# Patient Record
Sex: Female | Born: 1980 | Race: White | Hispanic: No | Marital: Single | Smoking: Former smoker
Health system: Southern US, Community
[De-identification: ages and names within clinical notes are randomized; demographics above are authoritative.]

## PROBLEM LIST (undated history)

## (undated) DIAGNOSIS — E1065 Type 1 diabetes mellitus with hyperglycemia: Secondary | ICD-10-CM

## (undated) DIAGNOSIS — E870 Hyperosmolality and hypernatremia: Secondary | ICD-10-CM

## (undated) DIAGNOSIS — E1069 Type 1 diabetes mellitus with other specified complication: Secondary | ICD-10-CM

## (undated) DIAGNOSIS — E785 Hyperlipidemia, unspecified: Secondary | ICD-10-CM

## (undated) HISTORY — DX: Type 1 diabetes mellitus with hyperglycemia: E10.65

## (undated) HISTORY — DX: Type 1 diabetes mellitus with other specified complication: E10.69

## (undated) HISTORY — DX: Hyperosmolality and hypernatremia: E87.0

---

## 2010-03-17 ENCOUNTER — Ambulatory Visit: Payer: Self-pay

## 2013-04-04 ENCOUNTER — Ambulatory Visit: Payer: Self-pay | Admitting: Unknown Physician Specialty

## 2013-11-01 ENCOUNTER — Encounter (INDEPENDENT_AMBULATORY_CARE_PROVIDER_SITE_OTHER): Payer: Self-pay

## 2013-11-01 ENCOUNTER — Ambulatory Visit
Admission: RE | Admit: 2013-11-01 | Discharge: 2013-11-01 | Disposition: A | Discharge status: Home / Self Care | Payer: Managed Care, Other (non HMO) | Source: Ambulatory Visit | Attending: *Deleted | Admitting: *Deleted

## 2013-11-01 ENCOUNTER — Other Ambulatory Visit: Payer: Self-pay | Admitting: *Deleted

## 2013-11-01 DIAGNOSIS — R52 Pain, unspecified: Secondary | ICD-10-CM

## 2013-11-28 ENCOUNTER — Other Ambulatory Visit (HOSPITAL_COMMUNITY): Payer: Managed Care, Other (non HMO) | Attending: Psychiatry | Admitting: Psychology

## 2013-11-28 ENCOUNTER — Encounter (HOSPITAL_COMMUNITY): Payer: Self-pay | Admitting: Psychology

## 2013-11-28 DIAGNOSIS — E109 Type 1 diabetes mellitus without complications: Secondary | ICD-10-CM | POA: Insufficient documentation

## 2013-11-28 DIAGNOSIS — Z598 Other problems related to housing and economic circumstances: Secondary | ICD-10-CM | POA: Insufficient documentation

## 2013-11-28 DIAGNOSIS — F10230 Alcohol dependence with withdrawal, uncomplicated: Secondary | ICD-10-CM

## 2013-11-28 DIAGNOSIS — F39 Unspecified mood [affective] disorder: Secondary | ICD-10-CM | POA: Insufficient documentation

## 2013-11-28 DIAGNOSIS — R51 Headache: Secondary | ICD-10-CM | POA: Insufficient documentation

## 2013-11-28 DIAGNOSIS — Z87891 Personal history of nicotine dependence: Secondary | ICD-10-CM | POA: Insufficient documentation

## 2013-11-28 DIAGNOSIS — Z9641 Presence of insulin pump (external) (internal): Secondary | ICD-10-CM | POA: Insufficient documentation

## 2013-11-28 DIAGNOSIS — Z794 Long term (current) use of insulin: Secondary | ICD-10-CM | POA: Insufficient documentation

## 2013-11-28 DIAGNOSIS — F1020 Alcohol dependence, uncomplicated: Secondary | ICD-10-CM | POA: Insufficient documentation

## 2013-11-28 DIAGNOSIS — Z5987 Material hardship due to limited financial resources, not elsewhere classified: Secondary | ICD-10-CM | POA: Insufficient documentation

## 2013-11-29 ENCOUNTER — Encounter (HOSPITAL_COMMUNITY): Payer: Self-pay | Admitting: Psychology

## 2013-11-29 DIAGNOSIS — F1020 Alcohol dependence, uncomplicated: Secondary | ICD-10-CM | POA: Insufficient documentation

## 2013-11-29 NOTE — Progress Notes (Signed)
    Daily Group Progress Note  Program: CD-IOP   Group Time: 1-2:30 pm  Participation Level: Active  Behavioral Response: Appropriate and Sharing  Type of Therapy: Psycho-education Group  Topic: Changing Thoughts/Attitudes/Behaviors in Early Recovery/New Member: the first half of group was spent in a psycho-ed. Members checked-in with sobriety dates and shared a little about what they had been doing since the last group.  One member shared that she had been reporting the wrong sobriety date just after beginning the program. She didn't like the number 6 and liked the idea of 09/27/13. She admitted that today she was scheduled to graduate, but after disclosing that bit of information to me yesterday, things had changed. A presentation was provided identifying the need to address the dysfunctional and dishonest thinking, attitudes and behaviors that one develops in their active addiction. One may stop using, but the distorted ways of being do not. They must be addressed rigorously and completely. Members identified the characteristics displayed during active addiction and then the contrasted them with the characteristics of sobriety. The remainder of the session was spent discussing the spiritual principle behind Step 1 which is "Honesty". During this session today, the program director met with 2 new group members.   Group Time: 2:45- 4pm  Participation Level: Active  Behavioral Response: Sharing  Type of Therapy: Process Group  Topic: Process/Introduction to New Group Member: the second half of group was spent in process. Members shared about issues and concerns in early recovery. A new group member was present today and she introduced herself and described what her alcoholism had caused her and how she intends to address it going forward. She had just been discharged from [facility]. There was good feedback and disclosure during this part of group. The session ended with members sharing their plans  for the days ahead.   Summary: The patient was new to the program. She introduced herself and shared about the events that had brought her here. The patient reported she had recently gotten her second DWI and lost her job. She had entered [facility] a few days later and was discharged on Monday. She was engaged and shared openly in the psycho-ed. She admitted that dishonesty is a really big problem. The patient reported she catches herself lying about things that she has no reason to lie about. The patient also admitted she has a very low sense of self and needs the group's help with that.  In process, the patient reported she is usually reserved and quiet, but she felt very comfortable in the group today and stated, "I am glad to be here".  The patient responded well to this first group session. A drug test was collected and the results should be available for the next session on Friday. Her sobriety date is 6/9.   Family Program: Family present? No   Name of family member(s):   UDS collected: Yes Results: not back from lab  AA/NA attended?: [place] and Tuesday  Sponsor?: No, but she is seeking a sponsor   Georgi Tuel, LCAS
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
## 2013-11-30 ENCOUNTER — Other Ambulatory Visit (HOSPITAL_COMMUNITY): Payer: Managed Care, Other (non HMO) | Admitting: Psychology

## 2013-11-30 DIAGNOSIS — E108 Type 1 diabetes mellitus with unspecified complications: Principal | ICD-10-CM

## 2013-11-30 DIAGNOSIS — F1020 Alcohol dependence, uncomplicated: Secondary | ICD-10-CM

## 2013-11-30 DIAGNOSIS — IMO0002 Reserved for concepts with insufficient information to code with codable children: Secondary | ICD-10-CM

## 2013-11-30 DIAGNOSIS — E1065 Type 1 diabetes mellitus with hyperglycemia: Secondary | ICD-10-CM

## 2013-12-03 ENCOUNTER — Encounter (HOSPITAL_COMMUNITY): Payer: Self-pay | Admitting: Psychology

## 2013-12-03 ENCOUNTER — Other Ambulatory Visit (HOSPITAL_COMMUNITY): Payer: Managed Care, Other (non HMO) | Admitting: Psychology

## 2013-12-03 DIAGNOSIS — E108 Type 1 diabetes mellitus with unspecified complications: Secondary | ICD-10-CM

## 2013-12-03 DIAGNOSIS — F10230 Alcohol dependence with withdrawal, uncomplicated: Secondary | ICD-10-CM

## 2013-12-03 DIAGNOSIS — E1065 Type 1 diabetes mellitus with hyperglycemia: Secondary | ICD-10-CM

## 2013-12-03 DIAGNOSIS — IMO0002 Reserved for concepts with insufficient information to code with codable children: Secondary | ICD-10-CM

## 2013-12-03 DIAGNOSIS — E109 Type 1 diabetes mellitus without complications: Secondary | ICD-10-CM | POA: Insufficient documentation

## 2013-12-03 NOTE — Progress Notes (Signed)
    Daily Group Progress Note  Program: CD-IOP   Group Time: 1-2:30 pm  Participation Level: Active  Behavioral Response: Appropriate and Sharing  Type of Therapy: Process Group  Topic: Process: the first half of group was spent in process. Members checked-in with sobriety dates followed by a 5 minute Heart Math session. Upon completion of this stress reduction exercise, members were invited to share an experiences or realizations they may have had since our last group session. Members were asked to identify what they had done to support their early recovery. There was a good discussion and feedback offered during this part of group.   Group Time: 2:45- 4pm  Participation Level: Active  Behavioral Response: Appropriate and Sharing  Type of Therapy: Psycho-education Group  Topic: "Wheel of Life: a psycho-ed was held in the second half of group. Members were provided a handout of the Wheel of Life. Each member was asked to identify where on the wheel they are relative to 8 sections that describe one's life. They rated each of the 8 categories and then were asked to draw them on the board. While they were disclosing the details of each category and why or why not they were dissatisfied, they were also asked to identify what they would be willing to improve on? The session invited more openness and sharing of each group members' life and proved very informative.   Summary: The patient reported she was doing 'pretty good'. When asked how the Heart Math was for her, she admitted her mind was racing and she found is almost impossible to slow down and focus. The patient reported her first court date for her recent DWI was scheduled for today, but her attorney was intent on having it continued. She reported that she was supposed to have dinner with 2 good friends last night, but they decided to go to a bar and she declined the invite. She admitted that their seeming inconsiderateness was hurtful.  Another good friend had planned on coming by to visit and take her to an AA meeting, cancelled at the last minute. The patient reported she had gotten a sponsor and has been instructed to phone her daily. In the psycho-ed, the patient identified a very rocky wheel. 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  This sudden loss of income also contributed to her poor financial status. Another member mentioned unemployment and the entire group encouraged her to apply for unemployment. One member noted her friend was fired because she was drunk at work, so if she could get it, certainly this woman could. The patient was empowered to explore her options and would contact and would contact the EOE. The patient shared about her poor sense of self and this brought up a brief discussion about core beliefs. I explained this patient has some very negative core beliefs that developed many years ago. The patient agreed with this and expressed the desire to address these negative beliefs and change them. She made some good comments and is very open with the group. The patient responded well to this intervention and her sobriety date remains 6/9.   Family Program: Family present? No   Name of family member(s):   UDS collected: No Results:   AA/NA attended?: YesWednesday and Thursday  Sponsor?: Yes   Caylen Kuwahara, LCAS

## 2013-12-04 ENCOUNTER — Encounter (HOSPITAL_COMMUNITY): Payer: Self-pay

## 2013-12-04 NOTE — Progress Notes (Signed)
    Daily Group Progress Note  Program: CD-IOP   Group Time: 1-2:30 pm  Participation Level: Active  Behavioral Response: Appropriate and Sharing  Type of Therapy: Process Group  Topic: Group Process: the first part of group was spent in process. Members checked in with their sobriety dates. One member introduced himself with a sobriety date of today. After check-in, the relapse was discussed at length. The member noted he had not attended any meetings from Friday to Sunday. He took a pain pill on Sunday. It was emphasized that this fellow had stopped working his program. The relapse had, indeed, occurred over a very clear time frame. Also present were two new group members who introduced themselves to their new group members. During this session, random drug tests were collected from all group members.   Group Time: 2:45- 4pm  Participation Level: Active  Behavioral Response: Sharing  Type of Therapy: Psycho-education Group  Topic: "The Pros & Cons of Active Addiction versus Sobriety". The second half of group was devoted to a psycho-education session. Members were asked to identify the good and bad of active addiction compared to sobriety. Their responses were put on the board. Some answers could be applied to both pro and con categories, i.e., the return of feelings. Despite the challenges and difficulties of remaining drug and alcohol-free in early recovery, there were very few Cons identified under the Sobriety category. Members agreed that the exercise had helped them clarify the importance and benefits of sobriety in their lives. There was good feedback among members, especially one new member who admitted the pros of using far outweigh the pros of halting his use. Despite disagreements, this young man was greeted with respect and encouragement.   Summary:The patient reported a good weekend filled with meetings and spending time with her sponsor and new women friends in recovery. In  response to one of the new group member's disclosures, she could not possibly remain sober if there was vodka or any other sort of alcohol in the house. In fact, she admitted opening the drawer to her nightstand only to be faced with a mini-bottle. It was empty, but she reported it had startled her and her roommate had thrown it into the recycling upon her request. The patient identified the 'return of feelings' as both a pro and con of sobriety and admitted that she is beginning to feel things she had long 'stuffed'. The patient was very attentive during the session and displays a commitment and intention that should serve her well. She made some good comments and responded well to this intervention. The patient's sobriety date remains 6/9.     Family Program: Family present? No   Name of family member(s):   UDS collected: Yes Results: not back from lab yet  AA/NA attended?: YesFriday, Saturday and Sunday  Sponsor?: Yes   [facility]

## 2013-12-05 ENCOUNTER — Other Ambulatory Visit (HOSPITAL_COMMUNITY): Payer: Managed Care, Other (non HMO) | Admitting: Psychology

## 2013-12-05 DIAGNOSIS — IMO0002 Reserved for concepts with insufficient information to code with codable children: Secondary | ICD-10-CM

## 2013-12-05 DIAGNOSIS — F10230 Alcohol dependence with withdrawal, uncomplicated: Secondary | ICD-10-CM

## 2013-12-05 DIAGNOSIS — E1065 Type 1 diabetes mellitus with hyperglycemia: Secondary | ICD-10-CM

## 2013-12-05 DIAGNOSIS — E108 Type 1 diabetes mellitus with unspecified complications: Secondary | ICD-10-CM

## 2013-12-06 ENCOUNTER — Encounter (HOSPITAL_COMMUNITY): Payer: Self-pay | Admitting: Psychology

## 2013-12-06 NOTE — Progress Notes (Unsigned)
Robin Alvarez CD-IOP: Treatment Planning Session. I met with the patient at the conclusion of her group session this afternoon. This group session represented her 3rd group session. I thanked her for her honesty and engagement in the group and assured her that her commitment and intensity will pay off for her as regards sobriety. The importance of identifying her treatment goals was emphasized and the patient was quick to identify ongoing sobriety as her primary treatment goal. She was in agreement that building support for her recovery was essential and admitted that she couldn't do it alone. She had secured a sponsor recently and feels good about working with this woman on the Steps. When asked what else she might want to begin to address while in the program, the patient reported that she recognized she was not good about expressing her feelings or possibly even what she was feeling. We agreed that she would take home a copy of the feeling wheel and place it where she can see it and practice identifying just 'what' she is feeling over the course of the day. The patient had been raised in a household where feelings weren't shared or even invited and she had learned this lesson well over the course of her 32 years. We discussed her legal issues and the pending DWI she has. I reminded her that we would provide her with documentation verifying her attendance and engagement in this program. The patient reported she would speak with her attorney and determine what might be beneficial regarding her treatment. The documentation was reviewed, signed, and the treatment plan completed accordingly. We will continue to monitor this patient closely in the days and weeks ahead. She is doing everything we have asked of her and her sobriety date remains 6/9.          Arliss Hepburn, LCAS
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
## 2013-12-07 ENCOUNTER — Other Ambulatory Visit (HOSPITAL_COMMUNITY): Payer: Managed Care, Other (non HMO) | Admitting: Psychology

## 2013-12-07 ENCOUNTER — Encounter (HOSPITAL_COMMUNITY): Payer: Self-pay

## 2013-12-07 DIAGNOSIS — F10230 Alcohol dependence with withdrawal, uncomplicated: Secondary | ICD-10-CM

## 2013-12-07 DIAGNOSIS — R4189 Other symptoms and signs involving cognitive functions and awareness: Secondary | ICD-10-CM

## 2013-12-07 DIAGNOSIS — R413 Other amnesia: Secondary | ICD-10-CM

## 2013-12-07 MED ORDER — ACAMPROSATE CALCIUM 333 MG PO TBEC: 666.0000 mg | DELAYED_RELEASE_TABLET | Freq: Three times a day (TID) | ORAL | Status: DC
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
## 2013-12-10 ENCOUNTER — Encounter (HOSPITAL_COMMUNITY): Payer: Self-pay | Admitting: Psychology

## 2013-12-10 ENCOUNTER — Other Ambulatory Visit (HOSPITAL_COMMUNITY): Payer: Managed Care, Other (non HMO) | Admitting: Psychology

## 2013-12-10 DIAGNOSIS — IMO0002 Reserved for concepts with insufficient information to code with codable children: Secondary | ICD-10-CM

## 2013-12-10 DIAGNOSIS — E1065 Type 1 diabetes mellitus with hyperglycemia: Secondary | ICD-10-CM

## 2013-12-10 DIAGNOSIS — E108 Type 1 diabetes mellitus with unspecified complications: Secondary | ICD-10-CM

## 2013-12-10 DIAGNOSIS — F10230 Alcohol dependence with withdrawal, uncomplicated: Secondary | ICD-10-CM
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
## 2013-12-11 ENCOUNTER — Encounter (HOSPITAL_COMMUNITY): Payer: Self-pay | Admitting: Psychology

## 2013-12-11 NOTE — Progress Notes (Signed)
    Daily Group Progress Note  Program: CD-IOP   Group Time: 1-2:30 pm  Participation Level: Active  Behavioral Response: Appropriate and Sharing  Type of Therapy: Process Group  Topic: Process/Psycho-Ed; the first part of group was spent in process. Members shared about the past weekend and events or situations that had challenged their sobriety. After checking-in, they also discussed the things they did to support their recovery. Halfway through the session, an explanation of the 4 elements that are involved in the development of addiction was provided. These are referred to the Bio-Psycho-Social-Spiritual elements. Only 1 of the 4 elements - the biological portion - cannot be changed. The remainder of the session was spent identifying how to address the other 3 elements in a way that supports and promotes sobriety.  Group Time: 2:45- 4pm  Participation Level: Active  Behavioral Response: Appropriate  Type of Therapy: Psycho-education Group  Topic: Psycho-Ed: Negative Core Beliefs. The second part of group was spent in a presentation on the development of negative core beliefs. A handout was provided. Members shared how some of their negative core beliefs may have developed. The new group members noted his father was a Environmental education officer and a part of him believes it is a choice that he makes to smoke crack. He feels even worse about himself when he thinks that he may just be choosing to smoke versus the lack of control in active addiction. Another member reported she would worry about someone might think negatively of her and not risk certain things as a result of these fears. There was good feedback among members and I reminded them that these beliefs must eventually be identified and addressed because they fuel one's addiction.   Summary: The patient reported she had had a good weekend. She had attended 3 meetings, including a speaker's meeting which was really powerful. She had done a couple  of things she hadn't done in years, including going to a movie and cooking dinner. The group applauded this news. The patient had attended a new meeting and also met with her sponsor. When asked about another member who had been with neighbors having cocktails, this patient agreed that she couldn't possibly put herself in that position - "I couldn't risk it", she explained. In the psycho-ed, the patient admitted she wouldn't ask someone for coffee at a meeting if they had made some really good comments. She was too afraid they would not want to join her and she would interpret this as rejection. She agreed that she has some very negative core beliefs. The patient made some good comments and responded well to this intervention. Her sobriety date remains 6/9.    Family Program: Family present? No   Name of family member(s):   UDS collected: No Results:   AA/NA attended?: YesFriday, Saturday and Sunday  Sponsor?: Yes   Robin Alvarez, LCAS

## 2013-12-12 ENCOUNTER — Other Ambulatory Visit (HOSPITAL_COMMUNITY): Payer: Managed Care, Other (non HMO) | Admitting: Psychology

## 2013-12-12 DIAGNOSIS — E108 Type 1 diabetes mellitus with unspecified complications: Secondary | ICD-10-CM

## 2013-12-12 DIAGNOSIS — E1065 Type 1 diabetes mellitus with hyperglycemia: Secondary | ICD-10-CM

## 2013-12-12 DIAGNOSIS — IMO0002 Reserved for concepts with insufficient information to code with codable children: Secondary | ICD-10-CM

## 2013-12-12 DIAGNOSIS — F10230 Alcohol dependence with withdrawal, uncomplicated: Secondary | ICD-10-CM

## 2013-12-13 ENCOUNTER — Encounter (HOSPITAL_COMMUNITY): Payer: Self-pay | Admitting: Psychology

## 2013-12-13 NOTE — Progress Notes (Signed)
    Daily Group Progress Note  Program: CD-IOP   Group Time: 1-2:30 pm  Participation Level: Active  Behavioral Response: Appropriate and Sharing  Type of Therapy: Process Group  Topic: Process/Dry Drunk: the first part of the session today was spent in process and a psycho-ed. Members shared about what they have done since the last group session.  One member checked-in with a new sobriety date and we processed his relapse. The emphasis was not on shaming this member, but providing everyone with further insight into red flags signaling possible relapse. During group today, the Investment banker, operational met with 2 new group members for their initial sessions with him and a discharge. A handout defining a "Dry Drunk" was provided and members read over the handout together. At the conclusion of this part of group, everyone agreed they understood what a dry drunk was.   Group Time: 2:45- 4pm  Participation Level: Active  Behavioral Response: Sharing  Type of Therapy: Activity Group/Graduation  Topic: Five Love Languages/Graduation: the second half of group was spent in an activity. Members completed a handout and later offered their ranking relative to the well-known book, The Five Love Languages. The importance of identifying and knowing how one most feels loved was emphasized. This is also true for an S/O or close relationships. None of the members had ever done the exercise and they all seemed to find it enlightening. At the conclusion of group a graduation ceremony was held for a member leaving the program today. Kind words of hope and gratitude were shared and the graduating member encouraged them to make the most of the opportunity this program provides.   Summary: The patient reported she was doing well. She had started her new job last night and it had gone much better than she had anticipated. The patient admitted she expects herself to be perfect and do everything right, "I have gotta be the  best", but she reported she had prayed yesterday morning and all day yesterday and last night and this morning. She was very relaxed and admitted everything went just fine. She reminded the group that she had been in [employer] business for years and this proved fairly easy and smooth once she got into the groove. She reported she had attended 2 meetings and met with her sponsor. The patient participated in the dry drunk conversation and made some good comments. In the activity, she identified herself as favoring words of affirmations and quality time. The patient shared kind words with the graduating member and reported that she had been very welcoming to her when she walked in the first day of group. The patieint is making good progress and responded well to this intervention. Her sobriety date remains 6/9.   Family Program: Family present? No   Name of family member(s):   UDS collected: Yes Results: not back yet  AA/NA attended?: YesMonday and Tuesday  Sponsor?: Yes   Areesha Dehaven, LCAS

## 2013-12-14 ENCOUNTER — Other Ambulatory Visit (HOSPITAL_COMMUNITY): Payer: Managed Care, Other (non HMO) | Admitting: Psychology

## 2013-12-14 DIAGNOSIS — E1065 Type 1 diabetes mellitus with hyperglycemia: Secondary | ICD-10-CM

## 2013-12-14 DIAGNOSIS — IMO0002 Reserved for concepts with insufficient information to code with codable children: Secondary | ICD-10-CM

## 2013-12-14 DIAGNOSIS — F10230 Alcohol dependence with withdrawal, uncomplicated: Secondary | ICD-10-CM

## 2013-12-14 DIAGNOSIS — E108 Type 1 diabetes mellitus with unspecified complications: Secondary | ICD-10-CM

## 2013-12-17 ENCOUNTER — Other Ambulatory Visit (HOSPITAL_COMMUNITY): Payer: BC Managed Care – PPO | Attending: Psychiatry | Admitting: Psychology

## 2013-12-17 ENCOUNTER — Encounter (HOSPITAL_COMMUNITY): Payer: Self-pay | Admitting: Psychology

## 2013-12-17 DIAGNOSIS — Z9641 Presence of insulin pump (external) (internal): Secondary | ICD-10-CM | POA: Insufficient documentation

## 2013-12-17 DIAGNOSIS — F1020 Alcohol dependence, uncomplicated: Secondary | ICD-10-CM | POA: Insufficient documentation

## 2013-12-17 DIAGNOSIS — F39 Unspecified mood [affective] disorder: Secondary | ICD-10-CM | POA: Insufficient documentation

## 2013-12-17 DIAGNOSIS — Z598 Other problems related to housing and economic circumstances: Secondary | ICD-10-CM | POA: Insufficient documentation

## 2013-12-17 DIAGNOSIS — Z794 Long term (current) use of insulin: Secondary | ICD-10-CM | POA: Insufficient documentation

## 2013-12-17 DIAGNOSIS — E109 Type 1 diabetes mellitus without complications: Secondary | ICD-10-CM | POA: Insufficient documentation

## 2013-12-17 DIAGNOSIS — Z87891 Personal history of nicotine dependence: Secondary | ICD-10-CM | POA: Insufficient documentation

## 2013-12-17 DIAGNOSIS — Z5987 Material hardship due to limited financial resources, not elsewhere classified: Secondary | ICD-10-CM | POA: Insufficient documentation

## 2013-12-17 DIAGNOSIS — E108 Type 1 diabetes mellitus with unspecified complications: Secondary | ICD-10-CM

## 2013-12-17 DIAGNOSIS — F10230 Alcohol dependence with withdrawal, uncomplicated: Secondary | ICD-10-CM

## 2013-12-17 DIAGNOSIS — IMO0002 Reserved for concepts with insufficient information to code with codable children: Secondary | ICD-10-CM

## 2013-12-17 DIAGNOSIS — R51 Headache: Secondary | ICD-10-CM | POA: Insufficient documentation

## 2013-12-17 DIAGNOSIS — E1065 Type 1 diabetes mellitus with hyperglycemia: Secondary | ICD-10-CM

## 2013-12-17 NOTE — Progress Notes (Signed)
    Daily Group Progress Note  Program: CD-IOP   Group Time: 1-2:30 pm  Participation Level: Active  Behavioral Response: Appropriate and Sharing  Type of Therapy: Process Group  Topic: Process; the first part of group was spent in process. Members shared about current issues and concerns. They also disclosed the things they had done to support their recovery, including meetings and speaking with their sponsor. The importance of attendance and being here for one's self and others was emphasized. This was pointed out in light of a few absences and disappearances by, apparently, "former" group members.   Group Time: 2:45- 4pm  Participation Level: Active  Behavioral Response: Sharing  Type of Therapy: Psycho-education Group  Topic: Emotional Buttons: the second half of group was spent in a psycho-ed. A handout was provided identifying different types of "buttons" that people might have. All of the 'buttons" listed deal with negative feelings that are generated when the "button" is pushed. The discussion was lively with good feedback and disclosure among group members.   Summary: The patient reported she is doing well. 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 She admitted she reminded herself that she might not be perfect, but that was okay. The patient put little pressure on herself and lowered her expectations of perfection and actually enjoyed the evening. She is also working Quarry managertonight, but will rest for the remainder of the weekend. During the session on buttons, the patient identified the "rejection and abandonment" buttons as big ones for her. She is very sensitive to what others are thinking and assumes that she has done something wrong or is inadequate if anything is amiss. The patient admitted her father was very devaluing and never complimented her on anything. He said that he and her mother 'expected' their daughter to achieve high grades and do things that  didn't warrant applause or mention because they were "expected". In her younger years, if she got a "C" on a paper, her father would question her about whether she wanted to be "average". I pointed out this is where her expectations of "perfection" come from. When questioned, the patient admitted she doesn't hold others to the standards she holds for herself. She agreed she is working to let go of this disparity and be gentler and kinder to herself. The patient is making good progress. She is attended 7-8 meetings per week and speaks with her sponsor daily. She made some good comments and responded well to this intervention. Her sobriety date remains 6/9.    Family Program: Family present? No   Name of family member(s):   UDS collected: No Results:   AA/NA attended?: YesWednesday and Thursday  Sponsor?: Yes   Samyak Sackmann, LCAS

## 2013-12-19 ENCOUNTER — Other Ambulatory Visit (HOSPITAL_COMMUNITY): Payer: BC Managed Care – PPO | Admitting: Psychology

## 2013-12-19 ENCOUNTER — Encounter (HOSPITAL_COMMUNITY): Payer: Self-pay | Admitting: Psychology

## 2013-12-19 DIAGNOSIS — E1065 Type 1 diabetes mellitus with hyperglycemia: Secondary | ICD-10-CM

## 2013-12-19 DIAGNOSIS — IMO0002 Reserved for concepts with insufficient information to code with codable children: Secondary | ICD-10-CM

## 2013-12-19 DIAGNOSIS — F10230 Alcohol dependence with withdrawal, uncomplicated: Secondary | ICD-10-CM

## 2013-12-19 DIAGNOSIS — E108 Type 1 diabetes mellitus with unspecified complications: Secondary | ICD-10-CM

## 2013-12-19 NOTE — Progress Notes (Addendum)
    Daily Group Progress Note  Program: CD-IOP   Group Time: 1-2:30 pm  Participation Level: Active  Behavioral Response: Appropriate and Sharing  Type of Therapy: Process Group/Psycho-Ed  Topic: Process/Psycho-Ed: the first half of group was spent in process with members updating the group on any events or issues that have appeared over the weekend One member shared about the session with his S/O and myself earlier this morning. A new member was present and she shared a little about herself. The session continued with a slide show from the [employer] Manual on "Triggers and Cravings". the presentation educated members on how triggers are developed as the addiction strengthens. There was good feedback and discussion during the show.  Group Time: 2:45- 4pm  Participation Level: Active  Behavioral Response: Sharing  Type of Therapy: Psycho-education Group  Topic: Deactivating Cravings: a handout was provided as a follow up to the slide show completed in the first part of group. A scenario about a fellow who had relapsed over the course of the day was read by members. The group was challenged to identify how and where this character had gone wrong. Members displayed good insight and understanding of the development of cravings. The discussion included examples from their own lives and past drug use and relapse.    Summary: The patient reported she had had a busy weekend. She had worked on Friday night as she had planned, but had been called on Saturday and asked to work Saturday evening as well. After consulting with her sponsor, she opted to work. The patient she had attended 2 AA meetings over the weekend. She disclosed that a friend who is not in recovery, but knows she is, had sent a text about whether she had any "bud". The group agreed with her that this was incredibly unkind and insensitive. She admitted it was very hurtful that this 'friend' had such little concern about her as  to seek drugs. The patient displayed good insight when discussing the 'deactivication' of cravings. She pointed out the mistakes the fellow in the handout made from the moment he got up that morning. The patient made some excellent comments and responded well to this intervention. She is doing everything we have asked of her and her sobriety date remains 6/9.    Family Program: Family present? No    Name of family member(s):   UDS collected: No Results:   AA/NA attended?: YesSaturday and Sunday  Sponsor?: Yes   Colston Pyle, LCAS

## 2013-12-20 ENCOUNTER — Encounter (HOSPITAL_COMMUNITY): Payer: Self-pay | Admitting: Psychology

## 2013-12-20 NOTE — Progress Notes (Signed)
Daily Group Progress Note  Program: CD-IOP   Group Time: 1-2:30 pm  Participation Level: Active  Behavioral Response: Appropriate and Sharing  Type of Therapy: Process Group  Topic: Process: the first part of group was spent in process. Members shared about what had occurred since we last met. During check-in, a patient identified a new sobriety date. This relapse was explored further with good feedback and discussion among group members. During this half of group, one member admitted another member's comments were very frustrating and really bothering her. I applauded her willingness to be honest - he had made distracting comments and interrupted repeatedly. The group discussed his behaviors, but whether he 'heard' these observations, remains to be seen.   Group Time: 2:45- 4pm  Participation Level: Active  Behavioral Response: Sharing  Type of Therapy: Psycho-education Group  Topic: Healthy Boundaries: the second half of group was spent in a psycho-ed. The topic of the presentation was on establishing healthy boundaries. These are particularly important in early recovery, when whatever boundaries one might have had in sobriety were abandoned with active addiction. There was frank disclosures and group members shared openly about their regrets of the past. At the end of the session, two members were asked to 'teach back'. Both made good comments and displayed a good understanding of the presentation.   Summary: The patient reported she had attended the '[employer]" meeting today at the [employer] and finally met Alvin. I had encouraged her to seek him out and introduce himself to her. The patient reported she had worked out to a video at home yesterday after attending a meeting and meeting with her sponsor. She had taken her dog to the vet this am and when I asked about this, the patient reported she had gotten up at 5:30 this morning. In part two of group, the patient reported she  had no boundaries in relationships in many instances. She admitted that she had been in a relationship with a man in [place] while she was attending [employer]. every weekend if he phoned her to come to his place, she would leave everything behind and go to [place]. She admitted she didn't really enjoy it many times, but had assumed that this was what she was supposed to do.  The patient admitted that she never considered the importance of her feelings until she was about 33 yo. The patient that her lack of boundaries came from a low sense of self or feeling of inadequacy. The patient continues to make good comments and displayed good insight into her own issues. She is doing everything we have asked of her and making solid progress in early recovery. The patient's sobriety date remains 6/9.    Family Program: Family present? No   Name of family member(s):   UDS collected: No Results:  AA/NA attended?: YesMonday, Tuesday and Wednesday  Sponsor?: Yes   ,, LCAS        

## 2013-12-21 ENCOUNTER — Other Ambulatory Visit (HOSPITAL_COMMUNITY): Payer: BC Managed Care – PPO | Admitting: Psychology

## 2013-12-21 DIAGNOSIS — IMO0002 Reserved for concepts with insufficient information to code with codable children: Secondary | ICD-10-CM

## 2013-12-21 DIAGNOSIS — E1065 Type 1 diabetes mellitus with hyperglycemia: Secondary | ICD-10-CM

## 2013-12-21 DIAGNOSIS — F10230 Alcohol dependence with withdrawal, uncomplicated: Secondary | ICD-10-CM

## 2013-12-21 DIAGNOSIS — E108 Type 1 diabetes mellitus with unspecified complications: Secondary | ICD-10-CM

## 2013-12-23 ENCOUNTER — Encounter (HOSPITAL_COMMUNITY): Payer: Self-pay | Admitting: Psychology

## 2013-12-23 NOTE — Progress Notes (Signed)
    Daily Group Progress Note  Program: CD-IOP   Group Time: 1-2:30 pm  Participation Level: Active  Behavioral Response: Appropriate and Sharing  Type of Therapy: Psycho-education Group  Topic: Pharmacist Visit: the first half of group was spent in a psycho-ed with a guest speaker. The guest was the pharmacist, EP, who dispense medications upstairs in the residential unit of [facility]. She described the effects that different drugs have on the body. The guest also educated on the medications used to address various psychiatric conditions and fielded numerous questions from the group members. During this time, the medical director, CK, was meeting with new group members and current members with medication questions or concerns.   Group Time: 2:45-4 pm  Participation Level: Active  Behavioral Response: Appropriate and Sharing  Type of Therapy: Process Group  Topic: Process: the second half of group was spent in process. Members shared about current issues or concerns. A new member, who had returned after missing the last group session, was tearful as she recounted the events leading up to her relapse. Members were attentive and supportive, but they also reminded her that her sobriety must come first.   Summary: The patient was attentive and engaged in the discussion with the pharmacist. She noted that she had begun taking Campral and was taking it as it was prescribed. The patient reported it has proven very helpful in reducing or even eliminating cravings.  The patient also shared about her diabetes and having managed it for almost 20 years. The pharmacist noted that the younger people are when they are diagnosed with diabetes, the more compliant they tend to be. In process, the patient reported had reported to work and she was not scheduled. Instead of leaving, the manager provided training, including opening some new wines and pouring her a glass. The patient agreed that this was very  upsetting and disappointing on many levels. The patient admitted she gave it a momentary thought, but then, her sober self kicked-in and she reminded herself she wouldn't pick up her 60 day chip on Sunday if she had a sip. The patient responded well to this intervention and continues to make excellent progress in her recovery. Her sobriety date remains 6/9.   Family Program: Family present? NO  Name of family member(s):   UDS collected: No Results:   AA/NA attended?: YesSaturday and Sunday  Sponsor?: Yes   Kindel Rochefort, LCAS

## 2013-12-24 ENCOUNTER — Other Ambulatory Visit (HOSPITAL_COMMUNITY): Payer: BC Managed Care – PPO | Admitting: Psychology

## 2013-12-24 DIAGNOSIS — F10230 Alcohol dependence with withdrawal, uncomplicated: Secondary | ICD-10-CM

## 2013-12-24 DIAGNOSIS — E108 Type 1 diabetes mellitus with unspecified complications: Secondary | ICD-10-CM

## 2013-12-24 DIAGNOSIS — E1065 Type 1 diabetes mellitus with hyperglycemia: Secondary | ICD-10-CM

## 2013-12-24 DIAGNOSIS — IMO0002 Reserved for concepts with insufficient information to code with codable children: Secondary | ICD-10-CM

## 2013-12-25 ENCOUNTER — Encounter (HOSPITAL_COMMUNITY): Payer: Self-pay | Admitting: Psychology

## 2013-12-25 NOTE — Progress Notes (Signed)
    Daily Group Progress Note  Program: CD-IOP   Group Time: 1-2:30 pm  Participation Level: Active  Behavioral Response: Appropriate and Sharing  Type of Therapy: Process Group  Topic: Process: the first part of group was spent in process. Members shared bout the past weekend. They disclosed about the meetings they had attended and other activities that have supported their recovery as well as any challenges or temptations that may have presented themselves. One member had relapsed and she was asked to share the events that had led to her drinking. She did not respond to my request - I reminded group members that the sheer act of disclosing and speaking one's experience is therapeutic and, ultimately, healing.  Also present were 3 new group members. Each of the new members was invited to share a little bit about themselves with their new group members.   Group Time:2:45- 4pm  Participation Level: Active  Behavioral Response: Sharing  Type of Therapy: Psycho-education Group  Topic: Cognitive Distortions: the second half of group was spent in a psycho-ed. Members were provided with a handout that identified different types of cognitive distortions. We read over the handout together and members commented on some of the distortions that they most frequently used. There was a good discussion and by sharing, members became more open and known among each other. During this group session, random drug tests were collected.   Summary: The patient reported she had had a busy weekend. 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 The manager assured her she would not schedule her for the bar and she apologized for having asked her to taste some wine last week. She hadn't realized that she was in recovery. The patient reported she had attended an AA cookout on saturday and it was really fun.  She had gone to her home group and spoken with her sponsor, but had rested for much of the remainder of the weekend. In the psycho-ed, the patient reported she was a Hydrographic surveyorcatastrophizer and mind reader, despite recognizing that both were illogical. She also read the thought for the day on "perfection". This patient has admitted being guilty of this expectation and she is working to let it go. She made some good comments and responded well to this intervention. Her sobriety date remains 6/9.      Family Program: Family present? No   Name of family member(s):   UDS collected: Yes Results: not back yet  AA/NA attended?: YesSaturday and Sunday  Sponsor?: Yes   Lateshia Schmoker, LCAS

## 2013-12-26 ENCOUNTER — Other Ambulatory Visit (HOSPITAL_COMMUNITY): Payer: BC Managed Care – PPO | Admitting: Psychology

## 2013-12-26 DIAGNOSIS — F10230 Alcohol dependence with withdrawal, uncomplicated: Secondary | ICD-10-CM

## 2013-12-26 DIAGNOSIS — R51 Headache: Secondary | ICD-10-CM | POA: Diagnosis not present

## 2013-12-26 DIAGNOSIS — Z5987 Material hardship: Secondary | ICD-10-CM | POA: Diagnosis not present

## 2013-12-26 DIAGNOSIS — Z9641 Presence of insulin pump (external) (internal): Secondary | ICD-10-CM | POA: Diagnosis not present

## 2013-12-26 DIAGNOSIS — Z87891 Personal history of nicotine dependence: Secondary | ICD-10-CM | POA: Diagnosis not present

## 2013-12-26 DIAGNOSIS — Z794 Long term (current) use of insulin: Secondary | ICD-10-CM | POA: Diagnosis not present

## 2013-12-26 DIAGNOSIS — E1065 Type 1 diabetes mellitus with hyperglycemia: Secondary | ICD-10-CM

## 2013-12-26 DIAGNOSIS — E109 Type 1 diabetes mellitus without complications: Secondary | ICD-10-CM | POA: Diagnosis not present

## 2013-12-26 DIAGNOSIS — Z598 Other problems related to housing and economic circumstances: Secondary | ICD-10-CM | POA: Diagnosis not present

## 2013-12-26 DIAGNOSIS — F1020 Alcohol dependence, uncomplicated: Secondary | ICD-10-CM | POA: Diagnosis not present

## 2013-12-26 DIAGNOSIS — F39 Unspecified mood [affective] disorder: Secondary | ICD-10-CM | POA: Diagnosis not present

## 2013-12-26 DIAGNOSIS — IMO0002 Reserved for concepts with insufficient information to code with codable children: Secondary | ICD-10-CM

## 2013-12-26 DIAGNOSIS — E108 Type 1 diabetes mellitus with unspecified complications: Secondary | ICD-10-CM

## 2013-12-28 ENCOUNTER — Other Ambulatory Visit (HOSPITAL_COMMUNITY): Payer: BC Managed Care – PPO | Admitting: Psychology

## 2013-12-28 DIAGNOSIS — E108 Type 1 diabetes mellitus with unspecified complications: Secondary | ICD-10-CM

## 2013-12-28 DIAGNOSIS — E1065 Type 1 diabetes mellitus with hyperglycemia: Secondary | ICD-10-CM

## 2013-12-28 DIAGNOSIS — F10230 Alcohol dependence with withdrawal, uncomplicated: Secondary | ICD-10-CM

## 2013-12-28 DIAGNOSIS — IMO0002 Reserved for concepts with insufficient information to code with codable children: Secondary | ICD-10-CM

## 2013-12-31 ENCOUNTER — Other Ambulatory Visit (HOSPITAL_COMMUNITY): Payer: BC Managed Care – PPO

## 2014-01-01 ENCOUNTER — Encounter (HOSPITAL_COMMUNITY): Payer: Self-pay | Admitting: Psychology
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
## 2014-01-02 ENCOUNTER — Other Ambulatory Visit (HOSPITAL_COMMUNITY): Payer: BC Managed Care – PPO | Admitting: Psychology

## 2014-01-02 DIAGNOSIS — IMO0002 Reserved for concepts with insufficient information to code with codable children: Secondary | ICD-10-CM

## 2014-01-02 DIAGNOSIS — E1065 Type 1 diabetes mellitus with hyperglycemia: Secondary | ICD-10-CM

## 2014-01-02 DIAGNOSIS — F10230 Alcohol dependence with withdrawal, uncomplicated: Secondary | ICD-10-CM

## 2014-01-02 DIAGNOSIS — E108 Type 1 diabetes mellitus with unspecified complications: Secondary | ICD-10-CM

## 2014-01-03 ENCOUNTER — Encounter (HOSPITAL_COMMUNITY): Payer: Self-pay | Admitting: Psychology
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
## 2014-01-04 ENCOUNTER — Other Ambulatory Visit (HOSPITAL_COMMUNITY): Payer: BC Managed Care – PPO | Admitting: Psychology

## 2014-01-04 DIAGNOSIS — F10230 Alcohol dependence with withdrawal, uncomplicated: Secondary | ICD-10-CM

## 2014-01-04 DIAGNOSIS — IMO0002 Reserved for concepts with insufficient information to code with codable children: Secondary | ICD-10-CM

## 2014-01-04 DIAGNOSIS — E1065 Type 1 diabetes mellitus with hyperglycemia: Secondary | ICD-10-CM

## 2014-01-04 DIAGNOSIS — E108 Type 1 diabetes mellitus with unspecified complications: Secondary | ICD-10-CM

## 2014-01-07 ENCOUNTER — Encounter (HOSPITAL_COMMUNITY): Payer: Self-pay | Admitting: Psychology

## 2014-01-07 ENCOUNTER — Other Ambulatory Visit (HOSPITAL_COMMUNITY): Payer: BC Managed Care – PPO | Admitting: Psychology

## 2014-01-07 DIAGNOSIS — E108 Type 1 diabetes mellitus with unspecified complications: Secondary | ICD-10-CM

## 2014-01-07 DIAGNOSIS — IMO0002 Reserved for concepts with insufficient information to code with codable children: Secondary | ICD-10-CM

## 2014-01-07 DIAGNOSIS — F10230 Alcohol dependence with withdrawal, uncomplicated: Secondary | ICD-10-CM

## 2014-01-07 DIAGNOSIS — E1065 Type 1 diabetes mellitus with hyperglycemia: Secondary | ICD-10-CM

## 2014-01-07 NOTE — Progress Notes (Signed)
    Daily Group Progress Note  Program: CD-IOP   Group Time: 1-2:30 pm  Participation Level: Active  Behavioral Response: Appropriate and Sharing  Type of Therapy: Process Group  Topic: Group Process: the first part of group was spent in process. Members shared about current issues and concerns in early recovery. One member had missed the last group session and disclosed that she had relapsed Wednesday. She cried as she recounted this and admitted she felt as if she had let the group down. Members  assured her that this was not the case. The relapse was diagrammed on the board with feedback from members about what could have been done differently. The session proved very informative.  A new group member was present and she introduced herself  during this part of group. During the group session, the program director was meeting with new group members.   Group Time: 2:45- 4pm  Participation Level: Active  Behavioral Response: Sharing  Type of Therapy: Psycho-education Group  Topic: Family Sculpture: the second part of group was spent in a psycho-ed. Members were asked to 'sculpt' their biological family. An explanation was provided and 3 family sculptures were completed. The session proved emotional for some of those members and provided good insight and more understanding about group members' lives and experiences.   Summary: The patient was attentive and engaged. She provided good feedback to the member who had relapsed. She agreed with her that she struggled with traditional religion, but explained that she felt most spiritual outdoors. In [place] is where she found her Higher Power. In part two of group today, the patient offered to 'sculpt' her family. It proved to be a powerful sculpture and the patient cried as she recounted her feelings, especially since her father's death 5 years ago. Other members provided good feedback about the sculpture. The patient reported that this sculpture  had allowed her to view her past differently and will give her a more complete closure about events in her younger life. She responded well to this intervention and it proved very therapeutic to her. The patient's sobriety date remains 6/9.   Family Program: Family present? No   Name of family member(s):   UDS collected: No Results:   AA/NA attended?: YesWednesday and Thursday  Sponsor?: Yes   Niajah Sipos, LCAS

## 2014-01-09 ENCOUNTER — Other Ambulatory Visit (HOSPITAL_COMMUNITY): Payer: BC Managed Care – PPO | Admitting: Psychology

## 2014-01-09 DIAGNOSIS — IMO0002 Reserved for concepts with insufficient information to code with codable children: Secondary | ICD-10-CM

## 2014-01-09 DIAGNOSIS — F10230 Alcohol dependence with withdrawal, uncomplicated: Secondary | ICD-10-CM

## 2014-01-09 DIAGNOSIS — E1065 Type 1 diabetes mellitus with hyperglycemia: Secondary | ICD-10-CM

## 2014-01-09 DIAGNOSIS — E108 Type 1 diabetes mellitus with unspecified complications: Secondary | ICD-10-CM

## 2014-01-11 ENCOUNTER — Other Ambulatory Visit (HOSPITAL_COMMUNITY): Payer: BC Managed Care – PPO | Admitting: Psychology

## 2014-01-11 DIAGNOSIS — E108 Type 1 diabetes mellitus with unspecified complications: Secondary | ICD-10-CM

## 2014-01-11 DIAGNOSIS — F10230 Alcohol dependence with withdrawal, uncomplicated: Secondary | ICD-10-CM

## 2014-01-11 DIAGNOSIS — IMO0002 Reserved for concepts with insufficient information to code with codable children: Secondary | ICD-10-CM

## 2014-01-11 DIAGNOSIS — E1065 Type 1 diabetes mellitus with hyperglycemia: Secondary | ICD-10-CM

## 2014-01-13 ENCOUNTER — Encounter (HOSPITAL_COMMUNITY): Payer: Self-pay | Admitting: Psychology

## 2014-01-13 NOTE — Progress Notes (Signed)
    Daily Group Progress Note  Program: CD-IOP   Group Time: 1-2:30 pm  Participation Level: Active  Behavioral Response: Appropriate and Sharing  Type of Therapy: Process Group  Topic: Process; the first half of group was spent in process. Members shared about the past weekend and any issues or concerns they have experienced in early recovery. One member shared about concerns about rumors that have been spreading about having been drinking despite the fact that she has been sober for more than a month. One member brought something to honor today - the 5 year anniversary of another group member's father. She spoke about her feelings and realizations on this special day. All 5 group members present had remained clean and sober, per their reports and drug tests were collected from all present.  Group Time: 2:45- 4 pm  Participation Level: Active  Behavioral Response: Sharing  Type of Therapy: Psycho-education Group  Topic: Family Sculpture: the second half of group was spent in a psycho-ed. Members participated in [facility]. A group member offered to 'sculpt' his family. Four group members were needed to provide the piece and the sculpture was very compelling. The 'artist' had tears as he recounted the actions portrayed by his sculpture and members were patient and caring as they asked questions about the positions. The session proved very powerful and painful for all. The patient admitted he didn't think he would be so affected by the experience, but agreed that it was helpful to get it out and view it from a distance. The session proved very powerful for those present.  Summary: The patient reported she had had been very emotional over the last few days. Another member pointed out that today was the anniversary of her father's death. She had shared this on Oct 06, 2022 and most of the members present had remembered it. The patient agreed that she had spent Sunday with her mother and cried  as she drove home and heard a song on the radio that reminded her of her father. She had cried for an hour after getting home. I validated her willingness to experience these feelings and reminded her that for the last 5 years she has done everything to avoid those very feelings. This year, at this time, she is sober and willing to go there. The patient reported that she had spent the morning with her roommate drinking coffee on the porch and talking. She had attended a meeting later this morning and would meet with her sponsor after this group. The patient reported she had made plans ahead of time, to insure she was very busy and accountable today. The patient cried as she recounted the emotion she has felt, but agreed that she was also happy to be feeling them. The patient made some good comments and provided feedback during the sculpture. She is making good progress and her sobriety date remains 6/9.     Family Program: Family present? No   Name of family member(s):   UDS collected: Yes Results: negative  AA/NA attended?: YesSaturday and Sunday  Sponsor?: Yes   Crockett Rallo, LCAS

## 2014-01-13 NOTE — Progress Notes (Signed)
    Daily Group Progress Note  Program: CD-IOP   Group Time: 1-2:30 pm  Participation Level: Active  Behavioral Response: Appropriate and Sharing  Type of Therapy: Process Group  Topic: Group process: the first half of group was spent in process. Members shared about current issues and concerns in early recovery. There were two new group members present today. The medical director was meeting with group members today and he asked to speak with them during this session.  There were good comments and feedback during the half of group. Two drug tests were collected during this session.  Group Time: 2:45- 4pm  Participation Level: Active  Behavioral Response: Sharing  Type of Therapy: Psycho-education Group  Topic: Psycho-Ed: the second half of group was spent in a session on Family Sculpture. Group members picked other members to represent their families and they placed them in positions that represented a theme of their early life. Once again, as in the previous two sessions, the sculpture proved very powerful. There were strong emotions, including pain and hurt, witnessed by all during the sculpture. During this half of group, one of the new group members introduced herself and told her story. It proved very compelling and the new member displayed a depth of understanding that motivated those witnessing her story. The session proved very powerful.   Summary: The patient reported she was feeling good. She admitted that she has continued to think about the sculpture she completed on Monday. The patient admitted she feels more at peace about her deceased father and their relationship since having completed her sculpture. The patient reported she had met with her mother and had addressed the 9th step with her. She apologized for her behaviors in past years and her mother cried as she talked. It proved to be very powerful for both of them. In the psycho-ed, the patient served as a family member  in a sculpture. She provided good feedback to fellow group members and responded well to this intervention. The patient's sobriety date remains 6/9.    Family Program: Family present? No   Name of family member(s):   UDS collected: No Results:   AA/NA attended?: YesMonday and Tuesday  Sponsor?: Yes   Lanaya Bennis, LCAS

## 2014-01-14 ENCOUNTER — Other Ambulatory Visit (HOSPITAL_COMMUNITY): Payer: BC Managed Care – PPO | Admitting: Psychology

## 2014-01-14 DIAGNOSIS — IMO0002 Reserved for concepts with insufficient information to code with codable children: Secondary | ICD-10-CM

## 2014-01-14 DIAGNOSIS — E108 Type 1 diabetes mellitus with unspecified complications: Secondary | ICD-10-CM

## 2014-01-14 DIAGNOSIS — E1065 Type 1 diabetes mellitus with hyperglycemia: Secondary | ICD-10-CM

## 2014-01-14 DIAGNOSIS — F10230 Alcohol dependence with withdrawal, uncomplicated: Secondary | ICD-10-CM

## 2014-01-16 ENCOUNTER — Other Ambulatory Visit (HOSPITAL_COMMUNITY): Payer: Managed Care, Other (non HMO) | Attending: Psychiatry | Admitting: Psychology

## 2014-01-16 ENCOUNTER — Encounter (HOSPITAL_COMMUNITY): Payer: Self-pay | Admitting: Psychology

## 2014-01-16 DIAGNOSIS — F10230 Alcohol dependence with withdrawal, uncomplicated: Secondary | ICD-10-CM

## 2014-01-16 DIAGNOSIS — E108 Type 1 diabetes mellitus with unspecified complications: Secondary | ICD-10-CM

## 2014-01-16 DIAGNOSIS — IMO0002 Reserved for concepts with insufficient information to code with codable children: Secondary | ICD-10-CM

## 2014-01-16 DIAGNOSIS — E1065 Type 1 diabetes mellitus with hyperglycemia: Secondary | ICD-10-CM

## 2014-01-16 NOTE — Progress Notes (Signed)
    Daily Group Progress Note  Program: CD-IOP   Group Time: 1-2:30 pm  Participation Level: Active  Behavioral Response: Appropriate and Sharing  Type of Therapy: Process Group  Topic: Process:  The first portion of group was spent processing what had happened since the last meeting.  Group members shared about their weekend and recovery related activities and events that they engaged in.  A new group member was present and took time to introduce herself to the rest of the group.  The session was lively and each member engaged appropriately.  Group Time: 2:45- 4pm  Participation Level: Active  Behavioral Response: Sharing  Type of Therapy: Psycho-education Group  Topic: Psychoeducation:  The second half of group was spent discussing expectations of group therapy.  Two handouts were provided during this time.  The first outlined group commitments and appropriate behaviors, while the second emphasized the importance of group members' willingness to be open and receive feedback.  The session was intense and some members expressed concerns about their ability to hold others accountable.  No relapses were reported in group today.  Summary: The patient stated that her weekend had gone well, and that she had worked on Friday and Saturday nights.  She reported that she was also able to spend some time with her mother and is working on building that relationship.  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  She explained that she would keep both jobs, but made sure that she would not be overwhelmed by limiting her hours.  The patient has been attending AA meetings and has been talking with her sponsor.  She also stated that she has been feeling some loneliness.  She continues to do well in her recovery and her sobriety date remains 6/9.   Family Program: Family present? No   Name of family member(s):    UDS collected: No Results:   AA/NA attended?: YesSaturday and Sunday  Sponsor?: Yes   Shakendra Griffeth, LCAS

## 2014-01-17 ENCOUNTER — Encounter (HOSPITAL_COMMUNITY): Payer: Self-pay | Admitting: Psychology

## 2014-01-17 NOTE — Progress Notes (Signed)
    Daily Group Progress Note  Program: CD-IOP   Group Time: 1-2:30 pm  Participation Level: Active  Behavioral Response: Appropriate and Sharing  Type of Therapy: Process Group/Activity  Topic: Group Process/Progressive Relaxation Activity: the first part of group was spent in process. Members shared about current issues and concerns in early recovery. One member became resistant and irritated when challenged about lying late last week. Another member sided with her and reported he thought it was 'ridiculous' to be discussing these behaviors. Other group members appeared somewhat uncomfortable with the interactions, but encouraged the member to review her past behaviors more objectively. A guided progressive relaxation exercise was read with members closing their eyes and following the words. Most of the group members later reported enjoying the guided relaxation activity.  Group Time: 2:45- 4pm  Participation Level: Active  Behavioral Response: Sharing  Type of Therapy: Psycho-education Group  Topic: Movie - "Flight/Family Sculpture: the second part of the session opened with film clips from the movie "flight". The opening scene was watched and then discussed. Members identified the egotism of the leading character along with his quick anger and irritability. All agreed there was certainly no humility and lying was his norm. Later on, a member sculpted her family. There was good feedback among members as they discussed this woman's family sculpture. The session was powerful, but the two members who had become resistant remained distant throughout the remainder of the session and passive-aggressive behaviors were displayed during this session.   Summary: The patient reported she was trying to remain calm and not force anything, but she is a little impatient after not hearing anything more from her job interviews. She had actually applied for 4 different jobs and been received favorably,  but had not heard anything more and was getting worried. She had also dreamed last night about Pirates and noted that another group member was also in the dream. She had dreamed she had sipped on an alcoholic beverage and woke up very upset. The patient was relieved to discover it was just a dream. She provided good support and feedback to one of her fellow group members. The patient was part of the family sculpture and provided good feedback to the sculpting member. She has done everything we have asked of her and she continues to make excellent progress in her recovery. The patient responded well to this intervention and her sobriety date remains 6/9.   Family Program: Family present? No   Name of family member(s):   UDS collected: No Results:   AA/NA attended?: YesWednesday and Thursday  Sponsor?: Yes   Thara Searing, LCAS

## 2014-01-18 ENCOUNTER — Other Ambulatory Visit (HOSPITAL_COMMUNITY): Payer: Managed Care, Other (non HMO) | Admitting: Psychology

## 2014-01-18 ENCOUNTER — Encounter (HOSPITAL_COMMUNITY): Payer: Self-pay | Admitting: Psychology

## 2014-01-18 DIAGNOSIS — F10230 Alcohol dependence with withdrawal, uncomplicated: Secondary | ICD-10-CM

## 2014-01-18 DIAGNOSIS — E1065 Type 1 diabetes mellitus with hyperglycemia: Secondary | ICD-10-CM

## 2014-01-18 DIAGNOSIS — E108 Type 1 diabetes mellitus with unspecified complications: Secondary | ICD-10-CM

## 2014-01-18 DIAGNOSIS — IMO0002 Reserved for concepts with insufficient information to code with codable children: Secondary | ICD-10-CM

## 2014-01-18 NOTE — Progress Notes (Signed)
    Daily Group Progress Note  Program: CD-IOP   Group Time: 1-2:30 pm  Participation Level: Active  Behavioral Response: Appropriate and Sharing  Type of Therapy: Process Group  Topic: Process/Psychoeducation - After checking in, the group members went around and processed what had gone on since our last meeting.  The group members were open and everyone shared about recovery related activities and events that they had participated in.  The group then moved into psychoeducation about relapse warning signs.  Group members identified warning signs that they have seen in themselves and discussed ways to stay sober when certain triggers present themselves.  All group members were active during the session today.  Drug tests were collected today.  Group Time: 2:45- 4pm  Participation Level: Active  Behavioral Response: Sharing  Type of Therapy: Psycho-education Group  Topic: Psychoeducation/Graduation - The second part of group began with a new member introducing herself and telling the group about her history.  The group picked up with the relapse warning sign handout and continued to discuss their experiences with each item on the list.  Group members were able to identify with one another and even provide advice on what has worked for them in the past to deal with triggers.  The end of the session was spent celebrating one group member's graduation from the program.  No relapses were reported in group today.  Summary: The patient reported that she had a good couple of days and attended meetings daily.  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  She talked with her sponsor and wants to make sure that she puts her recovery first and make sure that she is not overwhelmed with work.  The patient reports having a hard time sitting with her feelings and "wants to feel different".  She stated that she had a craving to smoke but instead kept  reminded herself to "do the next right thing" and be present.  The patient is doing very well in her recovery and interventions are proving effective.  Her sobriety date remains 6/9.   Family Program: Family present? No   Name of family member(s):   UDS collected: Yes Results: negative  AA/NA attended?: YesTuesday and Wednesday  Sponsor?: Yes   Stryder Poitra, LCAS

## 2014-01-23 ENCOUNTER — Encounter (HOSPITAL_COMMUNITY): Payer: Self-pay

## 2014-01-23 ENCOUNTER — Other Ambulatory Visit (HOSPITAL_COMMUNITY): Payer: Managed Care, Other (non HMO) | Admitting: Psychology

## 2014-01-23 DIAGNOSIS — IMO0002 Reserved for concepts with insufficient information to code with codable children: Secondary | ICD-10-CM

## 2014-01-23 DIAGNOSIS — T7432XA Child psychological abuse, confirmed, initial encounter: Secondary | ICD-10-CM | POA: Insufficient documentation

## 2014-01-23 DIAGNOSIS — E108 Type 1 diabetes mellitus with unspecified complications: Secondary | ICD-10-CM

## 2014-01-23 DIAGNOSIS — F1021 Alcohol dependence, in remission: Secondary | ICD-10-CM

## 2014-01-23 DIAGNOSIS — E1065 Type 1 diabetes mellitus with hyperglycemia: Secondary | ICD-10-CM

## 2014-01-23 MED ORDER — ACAMPROSATE CALCIUM 333 MG PO TBEC: 666.0000 mg | DELAYED_RELEASE_TABLET | Freq: Three times a day (TID) | ORAL | Status: AC

## 2014-01-23 MED ORDER — TRAZODONE HCL 100 MG PO TABS: 100.0000 mg | ORAL_TABLET | Freq: Every day | ORAL | Status: DC
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
## 2014-01-25 ENCOUNTER — Other Ambulatory Visit (HOSPITAL_COMMUNITY): Payer: Managed Care, Other (non HMO) | Admitting: Psychology

## 2014-01-28 ENCOUNTER — Other Ambulatory Visit (HOSPITAL_COMMUNITY): Payer: Managed Care, Other (non HMO)

## 2014-01-28 ENCOUNTER — Encounter (HOSPITAL_COMMUNITY): Payer: Self-pay | Admitting: Psychology

## 2014-01-28 NOTE — Progress Notes (Signed)
    Daily Group Progress Note  Program: CD-IOP   Group Time: 1-2:30 pm  Participation Level: Active  Behavioral Response: Appropriate and Sharing  Type of Therapy: Process Group  Topic: Group Process: the first part of group was spent in process. Members shared about current issues and concerns. One member arrived late and made some excuses about his 2 minute late arrival. Another member called him out on his "bullshit". In turn, she was questioned by fellow group members since she has never spoken out. They wondered what was going on with her? She also had many excuses and the group didn't appear to buy into her explanations. During this session, the program director met with new members as well as ones discharging.   Group Time: 2:45- 4pm  Participation Level: Active  Behavioral Response: Sharing  Type of Therapy: Psycho-education Group  Topic: Relapse Warning Signs/"Step One" Reading/Graduation: the second half of group was spent in a psycho-ed on relapse warning signs. A handout was provided with members identifying those listed. Members identified their relapse triggers and discussed them. A member read her "Step One" handout. It was a very poignant reading and she cried as she recounted the damage from her alcohol use. The session ended with two graduations. There was cake and a pie to celebrate and kind words shared by all.   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 She had accepted his offer. The patient smiled as the group applauded this news and congratulated her. 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 She was very pleased and admitted that she had worked on being very patient and trying not to force or control things. The patient asked good questions of her fellow group members and made some excellent observations. in part two of group, the  patient identified: "preoccupation with one area of my life, a loss of daily structure and not having any fun". the patient read her "Step One" to the group and she had to stop a few times to gather herself. The reading was very powerful and other members very touched by the depth of her disclosure. The patient was honored for her successful completion of the program and the medallion ceremony was held for her today. She will complete the program successfully next Wednesday. She has made excellent progress in her recovery and is leaving our program with all of the tools she will need to remain alcohol and drug-free. The patient's sobriety date remains 6/9.     Family Program: Family present? No   Name of family member(s):   UDS collected: No Results:  AA/NA attended?: YesWednesday, Thursday and Friday  Sponsor?: Yes   Alithea Lapage, LCAS

## 2014-01-28 NOTE — Progress Notes (Addendum)
    Daily Group Progress Note  Program: CD-IOP   Group Time: 1-2:30 pm  Participation Level: The patient did not appear for this group session. She graduated from the program on Wednesday.   Behavioral Response: N/A  Type of Therapy: N/A  Topic: n/a Group Time: 2:45- 4pm  Participation Level: N/A  Behavioral Response: N/A  Type of Therapy: N/A  Topic: n/a.   Summary:  Family Program: Family present? No   Name of family member(s):   UDS collected: No Results:   AA/NA attended?: n/a  Sponsor?: N/A   Morena Mckissack, LCAS

## 2014-01-28 NOTE — Progress Notes (Signed)
    Daily Group Progress Note  Program: CD-IOP   Group Time: 1-2:30 pm  Participation Level: Active  Behavioral Response: Appropriate and Sharing  Type of Therapy: Process Group/Psycho-Ed  Topic: After checking in, the group gave one member who had relapsed some time to talk about her feelings and what may have contributed to her relapse.  The group was very encouraging to her and helped her realize that she had not let anyone in the group down.  The chaplain also came in during this part of group and provided some psychoeducation on anger.  The group discussed their views on what anger is, and also discussed some quotes that resonated with them.  Group Time: 2:45-4pm  Participation Level: Active  Behavioral Response: Sharing  Type of Therapy: Process Group  Topic: During the second part of the session, the group continued to process what had been going on for them since our last meeting and checked in about how the holiday weekend was.  New members introduced themselves, and the group gave them some feedback on some good meetings to go to in the area.  Individual group members' triggers and solutions were reviewed and drug tests were collected today as well.  Summary: Today was the patient's last group session and was also the day she was able to pick up her 90 day chip.  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  She stated that it was "surreal" to think that this was her last group and is amazed at how far she has come.  During the discussion on anger, the patient discussed how much anger she had felt both when she was diagnosed with diabetes and when her father passed away.  She stated that anger has eaten her up from the inside out during her lifetime and that she has had to learn how to forgive.  The patient shared some of her triggers and ways to deal with  them.  The patient met with the Program Director and her sobriety date remains 6/9.   Family Program: Family present? No   Name of family member(s):   UDS collected: No Results:   AA/NA attended?: YesMonday and Tuesday  Sponsor?: Yes   Lavida Patch, LCAS

## 2014-01-30 ENCOUNTER — Other Ambulatory Visit (HOSPITAL_COMMUNITY): Payer: Managed Care, Other (non HMO)

## 2014-02-01 ENCOUNTER — Other Ambulatory Visit (HOSPITAL_COMMUNITY): Payer: Managed Care, Other (non HMO)

## 2014-02-04 ENCOUNTER — Other Ambulatory Visit (HOSPITAL_COMMUNITY): Payer: Managed Care, Other (non HMO)

## 2014-02-14 ENCOUNTER — Encounter (HOSPITAL_COMMUNITY): Payer: Self-pay

## 2014-02-14 NOTE — Progress Notes (Unsigned)
    Daily Group Progress Note  Program: CD-IOP   Group Time: 1-2:30 pm  Participation Level: Active  Behavioral Response: Sharing  Type of Therapy: Psycho-education Group  Topic: Triggers: the first part of group was spent in a psycho-ed on "Triggers". Members identified some of their triggers and a discussion followed on addressing these triggers. There was good disclosure and feedback among group members.  Group Time: 2:45- 4pm  Participation Level: Active  Behavioral Response: Sharing  Type of Therapy: Psycho-education Group  Topic: Refusal Skills: the second half of group was spent discussing handouts and refusal skills. Members read from the handouts and discussed the options available when confronted or feeling pressured to use. Members challenged some of the examples, but the ensuing discussion was able to put them into perspective.   Summary: Patient was present for group and participated.  Patient discussed her recovery as it relates to her spending time with friends. Patient reports that she has had to say "no" to a vacation due to understanding that the vacation may be a trigger for her. Patient discussed saying "no" when asked to take drinks after work and her emotions behind her ability to say no. Patient discussed the upcoming anniversary of her father's death and how this will be the first anniversary that she is sober. Patient discussed the support she has from her friends and her sponsor and reports she feels fairly confident about remaining sober. Patient participated in reading and discussing the handouts "Saying No: Cases to Consider" and "Some Suggestions About Saying No." Patient reports that continuing treatment after detox has been her motivation for continuing to say "no" and remain in recovery. She responded well to the intervention.    Family Program: Family present? No   Name of family member(s):   UDS collected: No Results:   AA/NA attended?:  YesSunday  Sponsor?: Yes   [facility]

## 2014-03-04 DIAGNOSIS — E7800 Pure hypercholesterolemia, unspecified: Secondary | ICD-10-CM | POA: Insufficient documentation

## 2014-03-18 DIAGNOSIS — Z9641 Presence of insulin pump (external) (internal): Secondary | ICD-10-CM | POA: Insufficient documentation

## 2014-05-02 ENCOUNTER — Other Ambulatory Visit (HOSPITAL_COMMUNITY): Payer: Self-pay | Admitting: Medical

## 2014-08-14 ENCOUNTER — Encounter: Payer: Self-pay | Admitting: Psychiatry

## 2015-11-20 ENCOUNTER — Other Ambulatory Visit: Payer: Self-pay | Admitting: Family Medicine

## 2015-11-20 DIAGNOSIS — J0100 Acute maxillary sinusitis, unspecified: Secondary | ICD-10-CM

## 2015-11-25 ENCOUNTER — Ambulatory Visit
Admission: RE | Admit: 2015-11-25 | Discharge: 2015-11-25 | Disposition: A | Discharge status: Home / Self Care | Payer: BLUE CROSS/BLUE SHIELD | Source: Ambulatory Visit | Attending: Family Medicine | Admitting: Family Medicine

## 2015-11-25 DIAGNOSIS — J0100 Acute maxillary sinusitis, unspecified: Secondary | ICD-10-CM | POA: Diagnosis not present

## 2017-04-16 ENCOUNTER — Encounter (HOSPITAL_COMMUNITY): Payer: Self-pay | Admitting: Emergency Medicine

## 2017-04-16 ENCOUNTER — Other Ambulatory Visit: Payer: Self-pay

## 2017-04-16 ENCOUNTER — Emergency Department (HOSPITAL_COMMUNITY)
Admission: EM | Admit: 2017-04-16 | Discharge: 2017-04-16 | Disposition: A | Discharge status: Home / Self Care | Payer: BLUE CROSS/BLUE SHIELD | Attending: Emergency Medicine | Admitting: Emergency Medicine

## 2017-04-16 ENCOUNTER — Emergency Department (HOSPITAL_COMMUNITY): Payer: BLUE CROSS/BLUE SHIELD

## 2017-04-16 DIAGNOSIS — Z794 Long term (current) use of insulin: Secondary | ICD-10-CM | POA: Insufficient documentation

## 2017-04-16 DIAGNOSIS — R109 Unspecified abdominal pain: Secondary | ICD-10-CM | POA: Insufficient documentation

## 2017-04-16 DIAGNOSIS — Z79899 Other long term (current) drug therapy: Secondary | ICD-10-CM | POA: Diagnosis not present

## 2017-04-16 DIAGNOSIS — R112 Nausea with vomiting, unspecified: Secondary | ICD-10-CM

## 2017-04-16 DIAGNOSIS — E109 Type 1 diabetes mellitus without complications: Secondary | ICD-10-CM | POA: Diagnosis not present

## 2017-04-16 DIAGNOSIS — Z87891 Personal history of nicotine dependence: Secondary | ICD-10-CM | POA: Diagnosis not present

## 2017-04-16 LAB — COMPREHENSIVE METABOLIC PANEL
ALT: 29 U/L (ref 14–54)
AST: 30 U/L (ref 15–41)
Albumin: 3.9 g/dL (ref 3.5–5.0)
Alkaline Phosphatase: 62 U/L (ref 38–126)
Anion gap: 13 (ref 5–15)
BILIRUBIN TOTAL: 1.1 mg/dL (ref 0.3–1.2)
BUN: 9 mg/dL (ref 6–20)
CALCIUM: 8.7 mg/dL — AB (ref 8.9–10.3)
CHLORIDE: 105 mmol/L (ref 101–111)
CO2: 23 mmol/L (ref 22–32)
CREATININE: 0.64 mg/dL (ref 0.44–1.00)
Glucose, Bld: 115 mg/dL — ABNORMAL HIGH (ref 65–99)
Potassium: 3.4 mmol/L — ABNORMAL LOW (ref 3.5–5.1)
Sodium: 141 mmol/L (ref 135–145)
TOTAL PROTEIN: 7.2 g/dL (ref 6.5–8.1)

## 2017-04-16 LAB — CBC WITH DIFFERENTIAL/PLATELET
Basophils Absolute: 0 10*3/uL (ref 0.0–0.1)
Basophils Relative: 0 %
EOS ABS: 0.1 10*3/uL (ref 0.0–0.7)
EOS PCT: 1 %
HCT: 40.7 % (ref 36.0–46.0)
HEMOGLOBIN: 13.9 g/dL (ref 12.0–15.0)
Lymphocytes Relative: 5 %
Lymphs Abs: 0.9 10*3/uL (ref 0.7–4.0)
MCH: 33.2 pg (ref 26.0–34.0)
MCHC: 34.2 g/dL (ref 30.0–36.0)
MCV: 97.1 fL (ref 78.0–100.0)
MONO ABS: 1.3 10*3/uL — AB (ref 0.1–1.0)
MONOS PCT: 7 %
NEUTROS PCT: 87 %
Neutro Abs: 15.5 10*3/uL — ABNORMAL HIGH (ref 1.7–7.7)
Platelets: 348 10*3/uL (ref 150–400)
RBC: 4.19 MIL/uL (ref 3.87–5.11)
RDW: 12.7 % (ref 11.5–15.5)
WBC: 17.8 10*3/uL — ABNORMAL HIGH (ref 4.0–10.5)

## 2017-04-16 LAB — LIPASE, BLOOD: LIPASE: 33 U/L (ref 11–51)

## 2017-04-16 MED ORDER — SUCRALFATE 1 G PO TABS: 1.0000 g | ORAL_TABLET | Freq: Three times a day (TID) | ORAL | 0 refills | Status: DC

## 2017-04-16 MED ORDER — GI COCKTAIL ~~LOC~~
30.0000 mL | Freq: Once | ORAL | Status: AC
  Administered 2017-04-16: 30 mL via ORAL
  Filled 2017-04-16: qty 30

## 2017-04-16 MED ORDER — OMEPRAZOLE 20 MG PO CPDR: 20.0000 mg | DELAYED_RELEASE_CAPSULE | Freq: Every day | ORAL | 0 refills | Status: DC

## 2017-04-16 NOTE — ED Triage Notes (Signed)
Pt BIB EMS from home with epigastric pain worsening on palpation and vomiting starting around 0230. Patient endorses esophageal spasms. Patient endorses drinking 3 glasses of wine tonight. Given 4 mg zofran en route. Patient is diabetic with an insulin pump, CBG 119 with EMS.
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
## 2017-12-02 ENCOUNTER — Other Ambulatory Visit: Payer: Self-pay

## 2017-12-02 ENCOUNTER — Emergency Department (HOSPITAL_COMMUNITY)
Admission: EM | Admit: 2017-12-02 | Discharge: 2017-12-02 | Disposition: A | Discharge status: Home / Self Care | Payer: BLUE CROSS/BLUE SHIELD | Attending: Emergency Medicine | Admitting: Emergency Medicine

## 2017-12-02 ENCOUNTER — Encounter (HOSPITAL_COMMUNITY): Payer: Self-pay

## 2017-12-02 DIAGNOSIS — F1290 Cannabis use, unspecified, uncomplicated: Secondary | ICD-10-CM | POA: Insufficient documentation

## 2017-12-02 DIAGNOSIS — R202 Paresthesia of skin: Secondary | ICD-10-CM | POA: Insufficient documentation

## 2017-12-02 DIAGNOSIS — E109 Type 1 diabetes mellitus without complications: Secondary | ICD-10-CM | POA: Diagnosis not present

## 2017-12-02 DIAGNOSIS — F17200 Nicotine dependence, unspecified, uncomplicated: Secondary | ICD-10-CM | POA: Diagnosis not present

## 2017-12-02 DIAGNOSIS — M79601 Pain in right arm: Secondary | ICD-10-CM | POA: Insufficient documentation

## 2017-12-02 HISTORY — DX: Hyperlipidemia, unspecified: E78.5

## 2017-12-02 LAB — I-STAT CHEM 8, ED
BUN: 8 mg/dL (ref 6–20)
CHLORIDE: 100 mmol/L (ref 98–111)
CREATININE: 0.7 mg/dL (ref 0.44–1.00)
Calcium, Ion: 1.1 mmol/L — ABNORMAL LOW (ref 1.15–1.40)
Glucose, Bld: 119 mg/dL — ABNORMAL HIGH (ref 70–99)
HEMATOCRIT: 43 % (ref 36.0–46.0)
Hemoglobin: 14.6 g/dL (ref 12.0–15.0)
POTASSIUM: 3.7 mmol/L (ref 3.5–5.1)
Sodium: 137 mmol/L (ref 135–145)
TCO2: 25 mmol/L (ref 22–32)

## 2017-12-02 LAB — CBG MONITORING, ED: Glucose-Capillary: 122 mg/dL — ABNORMAL HIGH (ref 70–99)

## 2017-12-02 MED ORDER — DICLOFENAC EPOLAMINE 1.3 % TD PTCH
1.0000 | MEDICATED_PATCH | Freq: Two times a day (BID) | TRANSDERMAL | Status: DC
  Administered 2017-12-02: 1 via TRANSDERMAL
  Filled 2017-12-02: qty 1

## 2017-12-02 MED ORDER — DICLOFENAC EPOLAMINE 1.3 % TD PTCH: 1.0000 | MEDICATED_PATCH | Freq: Two times a day (BID) | TRANSDERMAL | 0 refills | Status: DC

## 2017-12-02 NOTE — ED Triage Notes (Signed)
Pt presents to ED from home for R arm pain. Pt reports occasional paraesthesia over the last few weeks, but has gotten worse in the last few days. Pt reports that she is now having pain and numbness in the arm.

## 2017-12-02 NOTE — ED Notes (Signed)
ED Provider at bedside. 

## 2017-12-02 NOTE — Discharge Instructions (Addendum)
Please call your primary care provider as soon as you can to follow-up regarding your visit today for further evaluation. Please return to the emergency department for any new or worsening symptoms or if your symptoms do not improve. 3.  You may use the diclofenac patch as prescribed, please apply over your right trapezius/shoulder as needed.  Contact a doctor if: You keep on having episodes of paresthesia. Your burning or prickling feeling gets worse when you walk. You have pain or cramps. You feel dizzy. You have a rash. Get help right away if: You feel weak. You have trouble walking or moving. You have problems speaking, understanding, or seeing. You feel confused. You cannot control when you pee (urinate) or poop (bowel movement). You lose feeling (numbness) after an injury. You pass out (faint).
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
## 2020-02-21 ENCOUNTER — Other Ambulatory Visit: Payer: Self-pay | Admitting: Obstetrics and Gynecology

## 2020-02-21 DIAGNOSIS — Z1231 Encounter for screening mammogram for malignant neoplasm of breast: Secondary | ICD-10-CM

## 2020-03-28 ENCOUNTER — Emergency Department
Admission: EM | Admit: 2020-03-28 | Discharge: 2020-03-29 | Disposition: A | Discharge status: Other Acute Inpatient Hospital | Payer: 59 | Attending: Emergency Medicine | Admitting: Emergency Medicine

## 2020-03-28 ENCOUNTER — Other Ambulatory Visit: Payer: Self-pay

## 2020-03-28 DIAGNOSIS — Y908 Blood alcohol level of 240 mg/100 ml or more: Secondary | ICD-10-CM

## 2020-03-28 DIAGNOSIS — F1290 Cannabis use, unspecified, uncomplicated: Secondary | ICD-10-CM | POA: Insufficient documentation

## 2020-03-28 DIAGNOSIS — E1069 Type 1 diabetes mellitus with other specified complication: Secondary | ICD-10-CM

## 2020-03-28 DIAGNOSIS — Z794 Long term (current) use of insulin: Secondary | ICD-10-CM | POA: Insufficient documentation

## 2020-03-28 DIAGNOSIS — R451 Restlessness and agitation: Secondary | ICD-10-CM | POA: Insufficient documentation

## 2020-03-28 DIAGNOSIS — E109 Type 1 diabetes mellitus without complications: Secondary | ICD-10-CM | POA: Diagnosis present

## 2020-03-28 DIAGNOSIS — E10649 Type 1 diabetes mellitus with hypoglycemia without coma: Secondary | ICD-10-CM | POA: Insufficient documentation

## 2020-03-28 DIAGNOSIS — F1020 Alcohol dependence, uncomplicated: Secondary | ICD-10-CM | POA: Diagnosis not present

## 2020-03-28 DIAGNOSIS — F332 Major depressive disorder, recurrent severe without psychotic features: Secondary | ICD-10-CM | POA: Diagnosis not present

## 2020-03-28 DIAGNOSIS — F329 Major depressive disorder, single episode, unspecified: Secondary | ICD-10-CM | POA: Insufficient documentation

## 2020-03-28 DIAGNOSIS — IMO0002 Reserved for concepts with insufficient information to code with codable children: Secondary | ICD-10-CM | POA: Diagnosis present

## 2020-03-28 DIAGNOSIS — Z20822 Contact with and (suspected) exposure to covid-19: Secondary | ICD-10-CM | POA: Insufficient documentation

## 2020-03-28 DIAGNOSIS — T383X2A Poisoning by insulin and oral hypoglycemic [antidiabetic] drugs, intentional self-harm, initial encounter: Secondary | ICD-10-CM

## 2020-03-28 DIAGNOSIS — E1065 Type 1 diabetes mellitus with hyperglycemia: Secondary | ICD-10-CM | POA: Diagnosis present

## 2020-03-28 DIAGNOSIS — Y906 Blood alcohol level of 120-199 mg/100 ml: Secondary | ICD-10-CM | POA: Diagnosis not present

## 2020-03-28 DIAGNOSIS — Z87891 Personal history of nicotine dependence: Secondary | ICD-10-CM | POA: Insufficient documentation

## 2020-03-28 DIAGNOSIS — T1491XA Suicide attempt, initial encounter: Secondary | ICD-10-CM

## 2020-03-28 LAB — COMPREHENSIVE METABOLIC PANEL
ALT: 20 U/L (ref 0–44)
AST: 24 U/L (ref 15–41)
Albumin: 4.1 g/dL (ref 3.5–5.0)
Alkaline Phosphatase: 64 U/L (ref 38–126)
Anion gap: 16 — ABNORMAL HIGH (ref 5–15)
BUN: 6 mg/dL (ref 6–20)
CO2: 24 mmol/L (ref 22–32)
Calcium: 8.9 mg/dL (ref 8.9–10.3)
Chloride: 104 mmol/L (ref 98–111)
Creatinine, Ser: 0.68 mg/dL (ref 0.44–1.00)
GFR, Estimated: >60 mL/min (ref >60)
Glucose, Bld: 113 mg/dL — ABNORMAL HIGH (ref 70–99)
Potassium: 3.5 mmol/L (ref 3.5–5.1)
Sodium: 144 mmol/L (ref 135–145)
Total Bilirubin: 0.5 mg/dL (ref 0.3–1.2)
Total Protein: 7.9 g/dL (ref 6.5–8.1)

## 2020-03-28 LAB — ETHANOL: Alcohol, Ethyl (B): 254 mg/dL — ABNORMAL HIGH (ref <10)

## 2020-03-28 LAB — CBG MONITORING, ED
Glucose-Capillary: 86 mg/dL (ref 70–99)
Glucose-Capillary: 113 mg/dL — ABNORMAL HIGH (ref 70–99)

## 2020-03-28 MED ORDER — HALOPERIDOL LACTATE 5 MG/ML IJ SOLN: 5.0000 mg | Freq: Once | INTRAMUSCULAR | Status: AC

## 2020-03-28 MED ORDER — DIPHENHYDRAMINE HCL 50 MG/ML IJ SOLN: 50.0000 mg | Freq: Once | INTRAMUSCULAR | Status: DC

## 2020-03-28 MED ORDER — SODIUM CHLORIDE 0.9 % IV SOLN: 250.0000 mL | INTRAVENOUS | Status: DC | PRN

## 2020-03-28 MED ORDER — HALOPERIDOL LACTATE 5 MG/ML IJ SOLN
INTRAMUSCULAR | Status: AC
  Administered 2020-03-28: 5 mg via INTRAMUSCULAR
  Filled 2020-03-28: qty 1

## 2020-03-28 MED ORDER — SODIUM CHLORIDE 0.9% FLUSH: 3.0000 mL | INTRAVENOUS | Status: DC | PRN

## 2020-03-28 MED ORDER — DIPHENHYDRAMINE HCL 50 MG/ML IJ SOLN
INTRAMUSCULAR | Status: AC
  Administered 2020-03-28: 50 mg via INTRAMUSCULAR
  Filled 2020-03-28: qty 1

## 2020-03-28 MED ORDER — MIDAZOLAM HCL 2 MG/2ML IJ SOLN: 2.0000 mg | Freq: Once | INTRAMUSCULAR | Status: AC

## 2020-03-28 MED ORDER — INSULIN ASPART 100 UNIT/ML ~~LOC~~ SOLN
0.0000 [IU] | SUBCUTANEOUS | Status: DC
  Administered 2020-03-29: 8 [IU] via SUBCUTANEOUS
  Administered 2020-03-29: 2 [IU] via SUBCUTANEOUS
  Filled 2020-03-28 (×2): qty 1

## 2020-03-28 MED ORDER — SODIUM CHLORIDE 0.9% FLUSH
3.0000 mL | Freq: Two times a day (BID) | INTRAVENOUS | Status: DC
  Administered 2020-03-29: 3 mL via INTRAVENOUS

## 2020-03-28 MED ORDER — MIDAZOLAM HCL 2 MG/2ML IJ SOLN
INTRAMUSCULAR | Status: AC
  Administered 2020-03-28: 2 mg via INTRAMUSCULAR
  Filled 2020-03-28: qty 2

## 2020-03-28 MED ORDER — DIPHENHYDRAMINE HCL 50 MG/ML IJ SOLN: 50.0000 mg | Freq: Once | INTRAMUSCULAR | Status: AC

## 2020-03-28 NOTE — ED Notes (Signed)
NT continuing checks on pt.

## 2020-03-28 NOTE — ED Notes (Signed)
Pt agrees not to touch IV line.

## 2020-03-28 NOTE — ED Notes (Signed)
Pt expresses frustration with IVC placed, refuses to dress out into scrubs or have belongings secured. Pt becomes irate with Megan, RN, and made aggressive motions (reading objects to through at the RN). Pt throwing objects in room.  Pt shouting that no one told her she had to stay. This RN attempted to talk to the pt about what an IVC order is and why it was placed, but patient interrupted and talked over stating her intent to leave. Pt informed by this RN and security that this is not allowable due to IVC orders. Pt states she was told by police (at unspecified time, pt arrived via EMS without police escort) that she she would not be kept and she would be allowed to leave tonight.   Pt then makes intent to leave, stating "I'm going to leave, you can't stop me." and begins walking towards exit door. EDP is able to talk to pt and inform her of her IVC status, as well as the reason for it and what it means. Pt reluctantly agrees to come back to room. Pt angrily enters hospital room and slams door, breaking wall mounted clock. EDP states that this is not acceptable behavior, as she has already damaged property, tried to violate the IVC orders, and threatened staff. Orders received for IM meds, as documented in [facility].   Pt shouting, expressing frustration, labile mood, angry outbursts. Pt yelling at this RN, [place], RN, and security personal. Pt eventually able to be calmed enough to take IM injections without restraint, though pt did argue about the necessity of the third injection. Pt became tearful and started to become hysterical, with agitation and loud complaint. Pt asserts "I'm going to just go to sleep and never wake up." Pt assured that we will be closely monitoring her to ensure she is as safe as possible.

## 2020-03-28 NOTE — ED Triage Notes (Addendum)
Pt with c/o hypoglycemia after taking 35u insulin at about 7pm this evening. Pt has known hx DM. Pt last ate yesterday at midnight. Pt states it was "kind of intentional to take 35u insulin". Pt denies HI, pt says she has SI "but does not want to go to a psych ward".  EMS: CBG: 65 on pt's pump. Pt given 25 PO glucose x2.

## 2020-03-28 NOTE — ED Notes (Signed)
Pt continues to deny SI/HI. Pt denies any intentions to use any medical equipment in room to self-harm. Pt agrees to tell this RN if she changes her mind. Will remove any remaining medical equipment once pt medically cleared.

## 2020-03-28 NOTE — ED Notes (Addendum)
EDP bedside.  Pt states she has dx anxiety and depression but does not take any medications. Pt states she "just took to much (insulin)".

## 2020-03-28 NOTE — ED Notes (Addendum)
Pt agreed to dress out into burgundy scrubs, yet pt is throwing clothing at wall and on floor as she removes them. NT bedside to assist this RN with dressing pt out.   This RN attempted to calmly explain to pt that cooperation with this process is necessary d/t IVC orders. Pt surrendered personal belongings stating "take them, take them all, but I better get them back". Pt reared cell phone back as to throw it at this RN, but pt decided to place cell phone into purse. Pt is showing increasing concern over insulin pump stating "that cost me 10 grand and I need it". Pt placed insulin pump in lab bag and in purse.   Pt continues to yell and speak over this RN as this RN is explaining the process of IVC and the details of which the process entails. Pt accusing this RN of laughing at her, this RN calmly stated that is not happening. Pt continues to accuse this RN and curse even when asked politely to use a more appropriate tone of voice.
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
## 2020-03-28 NOTE — ED Notes (Signed)
Pt asleep. Chest rise and fall visible. Bed locked low. Rail up.

## 2020-03-28 NOTE — ED Notes (Addendum)
Pt visibly and verbally upset with staff upon being informed of IVC status. Pt states "I was told by police and EMTs that I would not be kept anywhere against my will". Pt slammed door when requested to remove clothing after this RN informed her that the door would be closed prior to removing clothing.  Pt throwing belongings and broke clock on wall. Pt threatened to throw phone at this RN. Pt throwing clothing while removing them. Pt slammed door upon request to have a seat back in stretcher.  Pt verbally and physically aggressive toward staff.

## 2020-03-28 NOTE — ED Notes (Signed)
Pt belonging bags (x2) taken to [employer], tagged [employer] (since there is no Room 24 tag)

## 2020-03-28 NOTE — ED Notes (Addendum)
Pt's belongings placed in labeled white bag.   Tan bra Black shirt Grey pants Blue underwear Black shoes Black socks Tan pocket book - pt's insulin pump in bag.   Pt in burgundy scrubs.

## 2020-03-29 ENCOUNTER — Inpatient Hospital Stay
Admission: RE | Admit: 2020-03-29 | Discharge: 2020-03-31 | DRG: 885 | Disposition: A | Discharge status: Home / Self Care | Payer: 59 | Source: Intra-hospital | Attending: Behavioral Health | Admitting: Behavioral Health

## 2020-03-29 ENCOUNTER — Encounter: Payer: Self-pay | Admitting: Psychiatry

## 2020-03-29 DIAGNOSIS — F332 Major depressive disorder, recurrent severe without psychotic features: Secondary | ICD-10-CM | POA: Diagnosis present

## 2020-03-29 DIAGNOSIS — Z9151 Personal history of suicidal behavior: Secondary | ICD-10-CM | POA: Diagnosis not present

## 2020-03-29 DIAGNOSIS — E109 Type 1 diabetes mellitus without complications: Secondary | ICD-10-CM | POA: Diagnosis present

## 2020-03-29 DIAGNOSIS — T1491XA Suicide attempt, initial encounter: Secondary | ICD-10-CM

## 2020-03-29 DIAGNOSIS — Z87891 Personal history of nicotine dependence: Secondary | ICD-10-CM

## 2020-03-29 DIAGNOSIS — Z794 Long term (current) use of insulin: Secondary | ICD-10-CM | POA: Diagnosis not present

## 2020-03-29 DIAGNOSIS — F1020 Alcohol dependence, uncomplicated: Secondary | ICD-10-CM | POA: Diagnosis present

## 2020-03-29 DIAGNOSIS — E1065 Type 1 diabetes mellitus with hyperglycemia: Secondary | ICD-10-CM | POA: Diagnosis present

## 2020-03-29 DIAGNOSIS — Y908 Blood alcohol level of 240 mg/100 ml or more: Secondary | ICD-10-CM | POA: Diagnosis present

## 2020-03-29 DIAGNOSIS — IMO0002 Reserved for concepts with insufficient information to code with codable children: Secondary | ICD-10-CM

## 2020-03-29 DIAGNOSIS — T383X2A Poisoning by insulin and oral hypoglycemic [antidiabetic] drugs, intentional self-harm, initial encounter: Secondary | ICD-10-CM | POA: Diagnosis present

## 2020-03-29 DIAGNOSIS — Z9641 Presence of insulin pump (external) (internal): Secondary | ICD-10-CM | POA: Diagnosis present

## 2020-03-29 LAB — CBG MONITORING, ED
Glucose-Capillary: 147 mg/dL — ABNORMAL HIGH (ref 70–99)
Glucose-Capillary: 204 mg/dL — ABNORMAL HIGH (ref 70–99)
Glucose-Capillary: 278 mg/dL — ABNORMAL HIGH (ref 70–99)
Glucose-Capillary: 293 mg/dL — ABNORMAL HIGH (ref 70–99)

## 2020-03-29 LAB — CBC WITH DIFFERENTIAL/PLATELET
Abs Immature Granulocytes: 0.07 10*3/uL (ref 0.00–0.07)
Basophils Absolute: 0 10*3/uL (ref 0.0–0.1)
Basophils Relative: 0 %
Eosinophils Absolute: 0.1 10*3/uL (ref 0.0–0.5)
Eosinophils Relative: 1 %
HCT: 38.4 % (ref 36.0–46.0)
Hemoglobin: 12.9 g/dL (ref 12.0–15.0)
Immature Granulocytes: 1 %
Lymphocytes Relative: 32 %
Lymphs Abs: 2.8 10*3/uL (ref 0.7–4.0)
MCH: 32.4 pg (ref 26.0–34.0)
MCHC: 33.6 g/dL (ref 30.0–36.0)
MCV: 96.5 fL (ref 80.0–100.0)
Monocytes Absolute: 0.5 10*3/uL (ref 0.1–1.0)
Monocytes Relative: 6 %
Neutro Abs: 5.3 10*3/uL (ref 1.7–7.7)
Neutrophils Relative %: 60 %
Platelets: 325 10*3/uL (ref 150–400)
RBC: 3.98 MIL/uL (ref 3.87–5.11)
RDW: 13.3 % (ref 11.5–15.5)
WBC: 8.7 10*3/uL (ref 4.0–10.5)
nRBC: 0 % (ref 0.0–0.2)

## 2020-03-29 LAB — HEMOGLOBIN A1C
Hgb A1c MFr Bld: 7.4 % — ABNORMAL HIGH (ref 4.8–5.6)
Mean Plasma Glucose: 165.68 mg/dL

## 2020-03-29 LAB — RESPIRATORY PANEL BY RT PCR (FLU A&B, COVID)
Influenza A by PCR: NEGATIVE
Influenza B by PCR: NEGATIVE
SARS Coronavirus 2 by RT PCR: NEGATIVE

## 2020-03-29 LAB — GLUCOSE, CAPILLARY
Glucose-Capillary: 311 mg/dL — ABNORMAL HIGH (ref 70–99)
Glucose-Capillary: 270 mg/dL — ABNORMAL HIGH (ref 70–99)
Glucose-Capillary: 97 mg/dL (ref 70–99)

## 2020-03-29 LAB — ACETAMINOPHEN LEVEL: Acetaminophen (Tylenol), Serum: <10 ug/mL — ABNORMAL LOW (ref 10–30)

## 2020-03-29 LAB — SALICYLATE LEVEL: Salicylate Lvl: <7 mg/dL — ABNORMAL LOW (ref 7.0–30.0)

## 2020-03-29 MED ORDER — MAGNESIUM HYDROXIDE 400 MG/5ML PO SUSP: 30.0000 mL | Freq: Every day | ORAL | Status: DC | PRN

## 2020-03-29 MED ORDER — INSULIN ASPART 100 UNIT/ML ~~LOC~~ SOLN
12.0000 [IU] | Freq: Once | SUBCUTANEOUS | Status: AC
  Administered 2020-03-29: 12 [IU] via SUBCUTANEOUS
  Filled 2020-03-29: qty 1

## 2020-03-29 MED ORDER — HYDROXYZINE HCL 50 MG PO TABS
50.0000 mg | ORAL_TABLET | Freq: Three times a day (TID) | ORAL | Status: DC | PRN
  Administered 2020-03-29 – 2020-03-31 (×4): 50 mg via ORAL
  Filled 2020-03-29 (×4): qty 1

## 2020-03-29 MED ORDER — TRAZODONE HCL 100 MG PO TABS
100.0000 mg | ORAL_TABLET | Freq: Every evening | ORAL | Status: DC | PRN
  Administered 2020-03-29 – 2020-03-30 (×2): 100 mg via ORAL
  Filled 2020-03-29 (×2): qty 1

## 2020-03-29 MED ORDER — INSULIN ASPART 100 UNIT/ML ~~LOC~~ SOLN: 5.0000 [IU] | Freq: Three times a day (TID) | SUBCUTANEOUS | Status: DC

## 2020-03-29 MED ORDER — FLUTICASONE PROPIONATE 50 MCG/ACT NA SUSP
1.0000 | Freq: Every morning | NASAL | Status: DC
  Administered 2020-03-30 – 2020-03-31 (×3): 1 via NASAL
  Filled 2020-03-29: qty 16

## 2020-03-29 MED ORDER — ACETAMINOPHEN 325 MG PO TABS: 650.0000 mg | ORAL_TABLET | Freq: Four times a day (QID) | ORAL | Status: DC | PRN

## 2020-03-29 MED ORDER — INSULIN ASPART 100 UNIT/ML ~~LOC~~ SOLN
0.0000 [IU] | SUBCUTANEOUS | Status: DC
  Administered 2020-03-29: 12 [IU] via SUBCUTANEOUS
  Administered 2020-03-30: 20 [IU] via SUBCUTANEOUS
  Administered 2020-03-30: 2 [IU] via SUBCUTANEOUS
  Administered 2020-03-30: 4 [IU] via SUBCUTANEOUS
  Administered 2020-03-30: 16 [IU] via SUBCUTANEOUS
  Administered 2020-03-31 (×2): 12 [IU] via SUBCUTANEOUS
  Filled 2020-03-29 (×5): qty 1

## 2020-03-29 MED ORDER — INSULIN GLARGINE 100 UNIT/ML ~~LOC~~ SOLN
20.0000 [IU] | Freq: Every day | SUBCUTANEOUS | Status: DC
  Filled 2020-03-29: qty 0.2

## 2020-03-29 MED ORDER — PANTOPRAZOLE SODIUM 40 MG PO TBEC
40.0000 mg | DELAYED_RELEASE_TABLET | Freq: Every day | ORAL | Status: DC
  Administered 2020-03-29 – 2020-03-31 (×3): 40 mg via ORAL
  Filled 2020-03-29 (×3): qty 1

## 2020-03-29 MED ORDER — DULOXETINE HCL 30 MG PO CPEP
30.0000 mg | ORAL_CAPSULE | Freq: Every day | ORAL | Status: DC
  Filled 2020-03-29: qty 1

## 2020-03-29 MED ORDER — INSULIN ASPART 100 UNIT/ML ~~LOC~~ SOLN: 0.0000 [IU] | Freq: Three times a day (TID) | SUBCUTANEOUS | Status: DC

## 2020-03-29 MED ORDER — DULOXETINE HCL 30 MG PO CPEP
30.0000 mg | ORAL_CAPSULE | Freq: Every day | ORAL | Status: DC
  Administered 2020-03-29 – 2020-03-31 (×3): 30 mg via ORAL
  Filled 2020-03-29 (×3): qty 1

## 2020-03-29 MED ORDER — ALUM & MAG HYDROXIDE-SIMETH 200-200-20 MG/5ML PO SUSP: 30.0000 mL | ORAL | Status: DC | PRN

## 2020-03-29 MED ORDER — INSULIN ASPART 100 UNIT/ML ~~LOC~~ SOLN: 0.0000 [IU] | Freq: Every day | SUBCUTANEOUS | Status: DC

## 2020-03-29 NOTE — BH Assessment (Signed)
Patient is to be admitted to [facility] by Dr. Toni Amend.  Attending Physician will be Dr. Neale Burly.   Patient has been assigned to room 320, by [facility] Charge Nurse Megan.   ER staff is aware of the admission:  Ronnie, ER Secretary    Dr. Vicente Males, ER MD   Amy B., Patient's Nurse   Gretchen Portela, Patient Access.

## 2020-03-29 NOTE — BH Assessment (Signed)
Patient currently unable to participate in assessment at this time.

## 2020-03-29 NOTE — ED Notes (Signed)
Pt states she is not that hungry, requests meal tray be left at bedside. Pt denies further needs. Pt encouraged to notify staff should further needs arise. Pt states understanding.

## 2020-03-29 NOTE — BH Assessment (Signed)
Attempted to assess patient again with Psyc NP, patient is currently not cooperative and refusing to participate in assessment. Patient did however report that she intentionally tried to hurt herself by taking too much Insulin.   Will attempt to assess patient at a later time

## 2020-03-29 NOTE — ED Notes (Signed)
Pt given meal tray at this time. Pt denies further needs. Pt remains calm and cooperative at this time. Awaiting psychiatrist to round on patient.
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
## 2020-03-29 NOTE — ED Notes (Signed)
Pt resting, has been calm and cooperative this shift.

## 2020-03-29 NOTE — [facility] Suicide Risk Assessment (Signed)
[facility] INPATIENT:  Family/Significant Other Suicide Prevention Education  Suicide Prevention Education:  Patient Refusal for Family/Significant Other Suicide Prevention Education: The patient Robin Alvarez has refused to provide written consent for family/significant other to be provided Family/Significant Other Suicide Prevention Education during admission and/or prior to discharge.  Physician notified.  [name]  [name] 03/29/2020, 4:25 PM

## 2020-03-29 NOTE — Consult Note (Signed)
[facility] Face-to-Face Psychiatry Consult   Reason for Consult: Consult for 39 year old woman with a history of alcohol abuse and depression who came into the hospital after a suicide attempt by insulin overdose Referring Physician: Paduchowski Patient Identification: Robin Alvarez MRN:  [id number] Principal Diagnosis: Severe recurrent major depression without psychotic features (HCC) Diagnosis:  Principal Problem:   Severe recurrent major depression without psychotic features (HCC) Active Problems:   Alcohol dependence (HCC)   Type I diabetes mellitus with complication, uncontrolled (HCC)   Suicide attempt (HCC)   Total Time spent with patient: 1 hour  Subjective:   Robin Alvarez is a 39 y.o. female patient admitted with "I just feel hopeless".  HPI: Patient seen chart reviewed.  39 year old woman came to the hospital after taking an intentional overdose of insulin.  She has been drinking heavily for the last couple weeks.  She was intoxicated at work and was sent home from her job.  She became more depressed feeling hopeless and helpless.  She injected herself with an excessive amount of insulin and attempt to do her self harm or kill her self.  Then called a friend of hers who lives out of state but that person called 911.  Patient admits that her mood is been very depressed for at least the last few weeks.  Feels hopeless about her situation.  Has not been eating well at all because she is basically living on bottles of wine.  She denies any psychotic symptoms.  Not currently on any medication for depression or seeing anyone for outpatient treatment.  Major life stresses that she broke up with her partner at the beginning of the year then got evicted and has been living in a motel.  Past Psychiatric History: Patient has no previous inpatient hospitalizations but does have an outpatient history of treatment for depression with Cymbalta in the past.  Also has had outpatient substance abuse  treatment.  Risk to Self:   Risk to Others:   Prior Inpatient Therapy:   Prior Outpatient Therapy:    Past Medical History:  Past Medical History:  Diagnosis Date  . Hyperlipidemia   . Type I (juvenile type) diabetes mellitus with hyperosmolarity, not stated as uncontrolled    History reviewed. No pertinent surgical history. Family History:  Family History  Problem Relation Age of Onset  . Alcohol abuse Father   . Alcohol abuse Sister    Family Psychiatric  History: Patient denies any family history Social History:  Social History   Substance and Sexual Activity  Alcohol Use Yes  . Alcohol/week: 20.0 standard drinks  . Types: 20 Standard drinks or equivalent per week     Social History   Substance and Sexual Activity  Drug Use Yes  . Frequency: 2.0 times per week  . Types: Marijuana    Social History   Socioeconomic History  . Marital status: Unknown    Spouse name: Not on file  . Number of children: Not on file  . Years of education: Not on file  . Highest education level: Not on file  Occupational History  . Not on file  Tobacco Use  . Smoking status: Former Smoker    Packs/day: 1.50    Types: Cigarettes    Start date: 08/08/1994    Quit date: 08/08/2010    Years since quitting: 9.6  . Smokeless tobacco: Current User  . Tobacco comment: vapor  Substance and Sexual Activity  . Alcohol use: Yes    Alcohol/week: 20.0 standard  drinks    Types: 20 Standard drinks or equivalent per week  . Drug use: Yes    Frequency: 2.0 times per week    Types: Marijuana  . Sexual activity: Not Currently  Other Topics Concern  . Not on file  Social History Narrative  . Not on file   Social Determinants of Health   Financial Resource Strain:   . Difficulty of Paying Living Expenses: Not on file  Food Insecurity:   . Worried About Programme researcher, broadcasting/film/video in the Last Year: Not on file  . Ran Out of Food in the Last Year: Not on file  Transportation Needs:   . Lack of  Transportation (Medical): Not on file  . Lack of Transportation (Non-Medical): Not on file  Physical Activity:   . Days of Exercise per Week: Not on file  . Minutes of Exercise per Session: Not on file  Stress:   . Feeling of Stress : Not on file  Social Connections:   . Frequency of Communication with Friends and Family: Not on file  . Frequency of Social Gatherings with Friends and Family: Not on file  . Attends Religious Services: Not on file  . Active Member of Clubs or Organizations: Not on file  . Attends Banker Meetings: Not on file  . Marital Status: Not on file   Additional Social History:    Allergies:  No Known Allergies  Labs:  Results for orders placed or performed during the hospital encounter of 03/28/20 (from the past 48 hour(s))  CBG monitoring, ED (now and then every hour for 3 hours)     Status: None   Collection Time: 03/28/20  9:09 PM  Result Value Ref Range   Glucose-Capillary 86 70 - 99 mg/dL    Comment: Glucose reference range applies only to samples taken after fasting for at least 8 hours.  Respiratory Panel by RT PCR (Flu A&B, Covid) - Nasopharyngeal Swab     Status: None   Collection Time: 03/28/20  9:34 PM   Specimen: Nasopharyngeal Swab  Result Value Ref Range   SARS Coronavirus 2 by RT PCR NEGATIVE NEGATIVE    Comment: (NOTE) SARS-CoV-2 target nucleic acids are NOT DETECTED.  The SARS-CoV-2 RNA is generally detectable in upper respiratoy specimens during the acute phase of infection. The lowest concentration of SARS-CoV-2 viral copies this assay can detect is 131 copies/mL. A negative result does not preclude SARS-Cov-2 infection and should not be used as the sole basis for treatment or other patient management decisions. A negative result may occur with  improper specimen collection/handling, submission of specimen other than nasopharyngeal swab, presence of viral mutation(s) within the areas targeted by this assay, and  inadequate number of viral copies (<131 copies/mL). A negative result must be combined with clinical observations, patient history, and epidemiological information. The expected result is Negative.  Fact Sheet for Patients:  https://www.moore.com/  Fact Sheet for Healthcare Providers:  https://www.young.biz/  This test is no t yet approved or cleared by the [place] FDA and  has been authorized for detection and/or diagnosis of SARS-CoV-2 by FDA under an Emergency Use Authorization (EUA). This EUA will remain  in effect (meaning this test can be used) for the duration of the COVID-19 declaration under Section 564(b)(1) of the Act, 21 U.S.C. section 360bbb-3(b)(1), unless the authorization is terminated or revoked sooner.     Influenza A by PCR NEGATIVE NEGATIVE   Influenza B by PCR NEGATIVE NEGATIVE  Comment: (NOTE) The Xpert Xpress SARS-CoV-2/FLU/RSV assay is intended as an aid in  the diagnosis of influenza from Nasopharyngeal swab specimens and  should not be used as a sole basis for treatment. Nasal washings and  aspirates are unacceptable for Xpert Xpress SARS-CoV-2/FLU/RSV  testing.  Fact Sheet for Patients: https://www.moore.com/  Fact Sheet for Healthcare Providers: https://www.young.biz/  This test is not yet approved or cleared by the [place] FDA and  has been authorized for detection and/or diagnosis of SARS-CoV-2 by  FDA under an Emergency Use Authorization (EUA). This EUA will remain  in effect (meaning this test can be used) for the duration of the  Covid-19 declaration under Section 564(b)(1) of the Act, 21  U.S.C. section 360bbb-3(b)(1), unless the authorization is  terminated or revoked. Performed at [facility], [address] [place], [place] [address]   Comprehensive metabolic panel     Status: Abnormal   Collection Time: 03/28/20  9:34 PM  Result  Value Ref Range   Sodium 144 135 - 145 mmol/L   Potassium 3.5 3.5 - 5.1 mmol/L   Chloride 104 98 - 111 mmol/L   CO2 24 22 - 32 mmol/L   Glucose, Bld 113 (H) 70 - 99 mg/dL    Comment: Glucose reference range applies only to samples taken after fasting for at least 8 hours.   BUN 6 6 - 20 mg/dL   Creatinine, Ser 3.84 0.44 - 1.00 mg/dL   Calcium 8.9 8.9 - 66.5 mg/dL   Total Protein 7.9 6.5 - 8.1 g/dL   Albumin 4.1 3.5 - 5.0 g/dL   AST 24 15 - 41 U/L   ALT 20 0 - 44 U/L   Alkaline Phosphatase 64 38 - 126 U/L   Total Bilirubin 0.5 0.3 - 1.2 mg/dL   GFR, Estimated >99 >35 mL/min    Comment: (NOTE) Calculated using the CKD-EPI Creatinine Equation (2021)    Anion gap 16 (H) 5 - 15    Comment: Performed at [facility], [address] [place], [place] [address]  Ethanol     Status: Abnormal   Collection Time: 03/28/20  9:34 PM  Result Value Ref Range   Alcohol, Ethyl (B) 254 (H) <10 mg/dL    Comment: (NOTE) Lowest detectable limit for serum alcohol is 10 mg/dL.  For medical purposes only. Performed at [facility], [address] [place], [place] [address]   Hemoglobin A1c     Status: Abnormal   Collection Time: 03/28/20 10:29 PM  Result Value Ref Range   Hgb A1c MFr Bld 7.4 (H) 4.8 - 5.6 %    Comment: (NOTE) Pre diabetes:          5.7%-6.4%  Diabetes:              >6.4%  Glycemic control for   <7.0% adults with diabetes    Mean Plasma Glucose 165.68 mg/dL    Comment: Performed at [facility], [address], [place], [place] [address]  CBG monitoring, ED (now and then every hour for 3 hours)     Status: Abnormal   Collection Time: 03/28/20 10:35 PM  Result Value Ref Range   Glucose-Capillary 113 (H) 70 - 99 mg/dL    Comment: Glucose reference range applies only to samples taken after fasting for at least 8 hours.  Acetaminophen level     Status: Abnormal   Collection Time: 03/28/20 10:57 PM  Result Value Ref Range   Acetaminophen (Tylenol),  Serum <10 (L) 10 - 30 ug/mL    Comment: (NOTE) Therapeutic concentrations vary significantly. A range of 10-30 ug/mL  may be an effective concentration for many patients. However, some  are best treated at concentrations outside of this range. Acetaminophen concentrations >150 ug/mL at 4 hours after ingestion  and >50 ug/mL at 12 hours after ingestion are often associated with  toxic reactions.  Performed at [facility], [address] [place], [place] [address]   Salicylate level     Status: Abnormal   Collection Time: 03/28/20 10:57 PM  Result Value Ref Range   Salicylate Lvl <7.0 (L) 7.0 - 30.0 mg/dL    Comment: Performed at [facility], [address] [place], [place] [address]  CBG monitoring, ED (now and then every hour for 3 hours)     Status: Abnormal   Collection Time: 03/29/20 12:21 AM  Result Value Ref Range   Glucose-Capillary 204 (H) 70 - 99 mg/dL    Comment: Glucose reference range applies only to samples taken after fasting for at least 8 hours.  POC CBG, ED     Status: Abnormal   Collection Time: 03/29/20  1:52 AM  Result Value Ref Range   Glucose-Capillary 278 (H) 70 - 99 mg/dL    Comment: Glucose reference range applies only to samples taken after fasting for at least 8 hours.  CBC with Differential/Platelet     Status: None   Collection Time: 03/29/20  1:54 AM  Result Value Ref Range   WBC 8.7 4.0 - 10.5 K/uL   RBC 3.98 3.87 - 5.11 MIL/uL   Hemoglobin 12.9 12.0 - 15.0 g/dL   HCT 66.5 36 - 46 %   MCV 96.5 80.0 - 100.0 fL   MCH 32.4 26.0 - 34.0 pg   MCHC 33.6 30.0 - 36.0 g/dL   RDW 99.3 57.0 - 17.7 %   Platelets 325 150 - 400 K/uL   nRBC 0.0 0.0 - 0.2 %   Neutrophils Relative % 60 %   Neutro Abs 5.3 1.7 - 7.7 K/uL   Lymphocytes Relative 32 %   Lymphs Abs 2.8 0.7 - 4.0 K/uL   Monocytes Relative 6 %   Monocytes Absolute 0.5 0.1 - 1.0 K/uL   Eosinophils Relative 1 %   Eosinophils Absolute 0.1 0.0 - 0.5 K/uL   Basophils Relative 0 %    Basophils Absolute 0.0 0.0 - 0.1 K/uL   Immature Granulocytes 1 %   Abs Immature Granulocytes 0.07 0.00 - 0.07 K/uL    Comment: Performed at [facility], [address] [place], [place] [address]  POC CBG, ED     Status: Abnormal   Collection Time: 03/29/20  4:14 AM  Result Value Ref Range   Glucose-Capillary 293 (H) 70 - 99 mg/dL    Comment: Glucose reference range applies only to samples taken after fasting for at least 8 hours.  POC CBG, ED     Status: Abnormal   Collection Time: 03/29/20  7:46 AM  Result Value Ref Range   Glucose-Capillary 147 (H) 70 - 99 mg/dL    Comment: Glucose reference range applies only to samples taken after fasting for at least 8 hours.    Current Facility-Administered Medications  Medication Dose Route Frequency Provider Last Rate Last Admin  . 0.9 %  sodium chloride infusion  250 mL Intravenous PRN Gilles Chiquito, MD      . insulin aspart (novoLOG) injection 0-15 Units  0-15 Units Subcutaneous  Q4H Gilles Chiquito, MD   2 Units at 03/29/20 [phone]  . sodium chloride flush (NS) 0.9 % injection 3 mL  3 mL Intravenous Q12H Gilles Chiquito, MD   3 mL at 03/29/20 0305  . sodium chloride flush (NS) 0.9 % injection 3 mL  3 mL Intravenous PRN Gilles Chiquito, MD       Current Outpatient Medications  Medication Sig Dispense Refill  . insulin aspart (NOVOLOG) 100 UNIT/ML injection Inject 0-110 Units into the skin as directed. Use up to 110 units via insulin pump    . norethindrone-ethinyl estradiol 1/35 (PIRMELLA 1/35) tablet Take 1 tablet by mouth daily.      Musculoskeletal: Strength & Muscle Tone: within normal limits Gait & Station: normal Patient leans: N/A  Psychiatric Specialty Exam: Physical Exam Vitals and nursing note reviewed.  Constitutional:      Appearance: She is well-developed.  HENT:     Head: Normocephalic and atraumatic.  Eyes:     Conjunctiva/sclera: Conjunctivae normal.     Pupils: Pupils are equal, round, and  reactive to light.  Cardiovascular:     Heart sounds: Normal heart sounds.  Pulmonary:     Effort: Pulmonary effort is normal.  Abdominal:     Palpations: Abdomen is soft.  Musculoskeletal:        General: Normal range of motion.     Cervical back: Normal range of motion.  Skin:    General: Skin is warm and dry.  Neurological:     General: No focal deficit present.     Mental Status: She is alert.  Psychiatric:        Attention and Perception: Perception normal.        Mood and Affect: Mood is depressed.        Speech: Speech normal.        Behavior: Behavior is withdrawn.        Thought Content: Thought content includes suicidal ideation. Thought content includes suicidal plan.        Cognition and Memory: Memory is impaired.        Judgment: Judgment is impulsive.     Review of Systems  Constitutional: Positive for appetite change.  HENT: Negative.   Eyes: Negative.   Respiratory: Negative.   Cardiovascular: Negative.   Gastrointestinal: Negative.   Musculoskeletal: Negative.   Skin: Negative.   Neurological: Negative.   Psychiatric/Behavioral: Positive for behavioral problems, dysphoric mood, self-injury and suicidal ideas. The patient is nervous/anxious.     Blood pressure 112/68, pulse 78, temperature 98.8 F (37.1 C), temperature source Oral, resp. rate 16, height  (1.575 m), weight 71.7 kg, SpO2 100 %.Body mass index is 28.9 kg/m.  General Appearance: Casual  Eye Contact:  Fair  Speech:  Slow  Volume:  Decreased  Mood:  Depressed  Affect:  Congruent  Thought Process:  Coherent  Orientation:  Full (Time, Place, and Person)  Thought Content:  Logical  Suicidal Thoughts:  Yes.  with intent/plan  Homicidal Thoughts:  No  Memory:  Immediate;   Fair Recent;   Fair Remote;   Fair  Judgement:  Fair  Insight:  Fair  Psychomotor Activity:  Decreased  Concentration:  Concentration: Fair  Recall:  [employer] of Knowledge:  Fair  Language:  Fair  Akathisia:   No  Handed:  Right  AIMS (if indicated):     Assets:  Desire for Improvement Housing Resilience  ADL's:  Impaired  Cognition:  Impaired,  Mild  Sleep:        Treatment Plan Summary: Daily contact with patient to assess and evaluate symptoms and progress in treatment, Medication management and Plan This is a 39 year old woman with severe major depression recurrent alcohol abuse suicide attempt.  Feeling hopeless.  Minimal outpatient support.  Patient meets criteria and is appropriate for admission to the psychiatric unit.  I have spoken with the diabetes coordinator and we will follow their recommendations about replacing her insulin pump while she is on the unit.  Restarting low-dose Cymbalta which she says she was treated with in the past after checking that her current liver function looks adequate.  Case reviewed with patient and emergency room doctor and TTS.  We do have beds available here and I hope we will be able to get her downstairs before too long.  Disposition: Recommend psychiatric Inpatient admission when medically cleared. Supportive therapy provided about ongoing stressors.  Mordecai RasmussenJohn Rox Mcgriff, MD 03/29/2020 10:08 AM

## 2020-03-29 NOTE — H&P (Signed)
Psychiatric Admission Assessment Adult  Patient Identification: Robin Alvarez MRN:  [id number] Date of Evaluation:  03/29/2020 Chief Complaint:  Severe recurrent major depression without psychotic features (HCC) [F33.2] Principal Diagnosis: <principal problem not specified> Diagnosis:  Active Problems:   Severe recurrent major depression without psychotic features [facility]  [name] is a 39yo F with past psychiatric history of depressive disorder, who was admitted to [facility] unit due to worsened depression and s/p suicidal attempt via overdose on insulin in settings of alcohol intoxication.  History of Present Illness: Patient reports he was diagnosed with depression about ten years ago after the death of her father. She reports she had psychiatrist then and was started on Cymbalta; she reports the medication helped her get back to normal self, so she stopped taking medication and visiting psychiatrist. She reports that for the last ten years she would have depression on and off, used some counseling services. She reports that her depression worsened significantly last February (about 10 months ago) after break up with her fiance; also she switched insurance and her counselor was not able to see the patient anymore. She started "self-medicate" with alcohol. She was living with her mother and her mother "kicked her out" of her place due to drinking. Patient is staying at a motel currently. She is in good relationships with ex-fiance who is taking care of her dog. She has a plan to rent a room from her friend in several weeks. Patient reports that due to all the above stressors and lack of professtional mental health care, she is feeling "always down", depressed, empty, hopeless, helpless, tired during the day, unmotivated, has diminished interest or pleasure in activities, low self-esteem "I feel like a failure". She reports passive death wishes "as a relief". Denies having any current thoughts of harming  self or others. She denies past suicidal attempts. She thinks her recent attempt was an impulsive act while she was intoxicated with alcohol. Her BAL on admission was 254nm/dl. She reports she has been drinking heavily for the last couple weeks, was intoxicated at work and was sent home from her job, became more depressed feeling hopeless and injected herself with an excessive amount of insulin.  Then called a friend of hers who lives out of state but that person called 911. She reports she is glad to be alive now. She is help-seeking now.  Patient also complaints of high anxiety, mostly in settings of ongoing above-mentioned psycho-social stressors. Patient reports feeling tense when she thinks about the current life situation, unusually restless, experiencing difficulty concentrating because of worry, fear due to uncertain future. She denies panic attacks.  Patient denies any current or past symptoms of mania, such as increased energy, feeling irritable, easily distractible, unusual talkativeness. Denies decreased need for sleep.Patient denies any current or past hallucinations, does not express any delusions. Patient reports feeling safe in their environment.  Patient reports past history of childhood trauma and would not like to disclose details. She reports disturbing thoughts and flashbacks related to traumatic event.  She denies any current physical complaints. No withdrawal symptoms. She denies h/o alcohol withdrawal seizures, DTs. She denies any other substance use, except of occasional THC.  Past Psychiatric History:  H/o depressive disorder 10 years ago. Past psych med - Cymbalta; was effective, no adverse reactions.  Patient has no previous inpatient hospitalizations. Also has had outpatient substance abuse treatment. Denies h/o prior suicidal attempts.  Social history: She has no guardian. She lives at a motel. Single, no children.  Ex-fiance is supportive and watches her dog. Mother is  alive, complicated relationships. Patient has a job. No guns in possession.    Is the patient at risk to self? No.  Has the patient been a risk to self in the past 6 months? Yes.    Has the patient been a risk to self within the distant past? No.  Is the patient a risk to others? No.  Has the patient been a risk to others in the past 6 months? No.  Has the patient been a risk to others within the distant past? No.   Prior Inpatient Therapy:   Prior Outpatient Therapy:    Alcohol Screening:   Substance Abuse History in the last 12 months:  No. Consequences of Substance Abuse: NA Previous Psychotropic Medications: Yes  Psychological Evaluations: Yes  Past Medical History:  Past Medical History:  Diagnosis Date  . Hyperlipidemia   . Type I (juvenile type) diabetes mellitus with hyperosmolarity, not stated as uncontrolled    No past surgical history on file. Family History:  Family History  Problem Relation Age of Onset  . Alcohol abuse Father   . Alcohol abuse Sister    Family Psychiatric  History: unremarkable Tobacco Screening:   Social History:  Social History   Substance and Sexual Activity  Alcohol Use Yes  . Alcohol/week: 20.0 standard drinks  . Types: 20 Standard drinks or equivalent per week     Social History   Substance and Sexual Activity  Drug Use Yes  . Frequency: 2.0 times per week  . Types: Marijuana    Additional Social History:         Allergies:  No Known Allergies Lab Results:  Results for orders placed or performed during the hospital encounter of 03/29/20 (from the past 48 hour(s))  Glucose, capillary     Status: Abnormal   Collection Time: 03/29/20 12:46 PM  Result Value Ref Range   Glucose-Capillary 311 (H) 70 - 99 mg/dL    Comment: Glucose reference range applies only to samples taken after fasting for at least 8 hours.    Blood Alcohol level:  Lab Results  Component Value Date   ETH 254 (H) 03/28/2020    Metabolic Disorder  Labs:  Lab Results  Component Value Date   HGBA1C 7.4 (H) 03/28/2020   MPG 165.68 03/28/2020   No results found for: PROLACTIN No results found for: CHOL, TRIG, HDL, CHOLHDL, VLDL, LDLCALC  Current Medications: Current Facility-Administered Medications  Medication Dose Route Frequency Provider Last Rate Last Admin  . acetaminophen (TYLENOL) tablet 650 mg  650 mg Oral Q6H PRN Clapacs, John T, MD      . alum & mag hydroxide-simeth (MAALOX/MYLANTA) 200-200-20 MG/5ML suspension 30 mL  30 mL Oral Q4H PRN Clapacs, John T, MD      . DULoxetine (CYMBALTA) DR capsule 30 mg  30 mg Oral Daily Thalia Party, MD      . Melene Muller ON 03/30/2020] fluticasone (FLONASE) 50 MCG/ACT nasal spray 1 spray  1 spray Each Nare q morning - 10a Gedalya Jim, MD      . hydrOXYzine (ATARAX/VISTARIL) tablet 50 mg  50 mg Oral TID PRN Clapacs, John T, MD      . insulin aspart (novoLOG) injection 0-24 Units  0-24 Units Subcutaneous Q4H Shantil Vallejo, MD      . insulin aspart (novoLOG) injection 12 Units  12 Units Subcutaneous Once Garl Speigner, Serina Cowper, MD      . magnesium hydroxide (MILK OF MAGNESIA)  suspension 30 mL  30 mL Oral Daily PRN Clapacs, John T, MD      . pantoprazole (PROTONIX) EC tablet 40 mg  40 mg Oral Daily Lois Slagel, MD      . traZODone (DESYREL) tablet 100 mg  100 mg Oral QHS PRN Clapacs, Jackquline Denmark, MD       PTA Medications: Medications Prior to Admission  Medication Sig Dispense Refill Last Dose  . insulin aspart (NOVOLOG) 100 UNIT/ML injection Inject 0-110 Units into the skin as directed. Use up to 110 units via insulin pump     . norethindrone-ethinyl estradiol 1/35 (PIRMELLA 1/35) tablet Take 1 tablet by mouth daily.       Musculoskeletal: Strength & Muscle Tone: within normal limits Gait & Station: normal Patient leans: N/A  Psychiatric Specialty Exam: Physical Exam  Review of Systems  There were no vitals taken for this visit.There is no height or weight on file to calculate BMI.  General Appearance:  Casual  Eye Contact:  Good  Speech:  Clear and Coherent and Normal Rate  Volume:  Normal  Mood:  Depressed and Dysphoric  Affect:  Appropriate, Congruent and Constricted  Thought Process:  Coherent, Goal Directed and Linear  Orientation:  Full (Time, Place, and Person)  Thought Content:  Logical  Suicidal Thoughts:  No  Homicidal Thoughts:  No  Memory:  Immediate;   Fair Recent;   Fair Remote;   Fair  Judgement:  Fair  Insight:  Fair  Psychomotor Activity:  Normal  Concentration:  Concentration: Fair  Recall:  [employer] of Knowledge:  Fair  Language:  Fair  Akathisia:  No  Handed:  Right  AIMS (if indicated):     Assets:  Communication Skills Desire for Improvement Resilience Social Support  ADL's:  Intact  Cognition:  WNL  Sleep:       Treatment Plan Summary: Daily contact with patient to assess and evaluate symptoms and progress in treatment and Medication management  Patient is a 51.-year-old female with severe major depression recurrent alcohol abuse, s/p suicide attempt.   Patient meets criteria and will continue admission to the psychiatric unit; 15-minute checks; daily contact with patient to assess and evaluate symptoms and progress in treatment; psychoeducation. Patient restarted on Cymbalta 30mg  PO daily for the treatment of her depression and anxiety. Order also placed for as needed Hydroxyzine for anxiety and Trazodone for sleep. Due to the history of alcohol abuse, patient will be monitored on CIWA for the symptoms of acute withdrawal. For the management of diabetes she place on Insulin sliding scale and the Diabetes coordinator consult ordered and we will follow their recommendations about  insulin management while she is on the unit. EKG ordered as well.  Social worker to help patient set up with outpatient MH care.    Observation Level/Precautions:  15 minute checks  Laboratory:  CBG monitoring  Psychotherapy:    Medications:    Consultations:     Discharge Concerns:    Estimated LOS:  Other:     Physician Treatment Plan for Primary Diagnosis: <principal problem not specified> Long Term Goal(s): Improvement in symptoms so as ready for discharge  Short Term Goals: Ability to identify changes in lifestyle to reduce recurrence of condition will improve, Ability to verbalize feelings will improve, Ability to disclose and discuss suicidal ideas, Ability to demonstrate self-control will improve, Ability to identify and develop effective coping behaviors will improve, Ability to maintain clinical measurements within normal limits will improve, Compliance  with prescribed medications will improve and Ability to identify triggers associated with substance abuse/mental health issues will improve  Physician Treatment Plan for Secondary Diagnosis: Active Problems:   Severe recurrent major depression without psychotic features (HCC)  Long Term Goal(s): Improvement in symptoms so as ready for discharge  Short Term Goals: Ability to identify changes in lifestyle to reduce recurrence of condition will improve, Ability to verbalize feelings will improve, Ability to disclose and discuss suicidal ideas, Ability to demonstrate self-control will improve, Ability to identify and develop effective coping behaviors will improve, Ability to maintain clinical measurements within normal limits will improve, Compliance with prescribed medications will improve and Ability to identify triggers associated with substance abuse/mental health issues will improve  I certify that inpatient services furnished can reasonably be expected to improve the patient's condition.    Thalia PartyAlisa Yarisbel Miranda, MD 11/13/20212:43 PM

## 2020-03-29 NOTE — ED Notes (Signed)
Pt discharged under IVC to BMU.  VS stable. All belongings sent with patient.

## 2020-03-29 NOTE — ED Provider Notes (Addendum)
-----------------------------------------   3:07 AM on 03/29/2020 -----------------------------------------  Patient resting in no acute distress. CBC, acetaminophen and salicylate levels unremarkable. Remains hemodynamically stable. Blood sugars every hour for 3 hours remained stable. Will modify blood sugar checks to every 4 hours. Patient has sliding scale insulin already ordered. Noted very minimally elevated anion gap which is likely secondary to EtOH intoxication rather than DKA as patient's initial blood sugar was 65. At this time patient is medically cleared for psychiatric evaluation and disposition.  The patient has been placed in psychiatric observation due to the need to provide a safe environment for the patient while obtaining psychiatric consultation and evaluation, as well as ongoing medical and medication management to treat the patient's condition.  The patient has been placed under full IVC at this time.   ----------------------------------------- 5:28 AM on 03/29/2020 -----------------------------------------  No further events overnight.  Per sliding scale insulin orders, patient recently received 8 units subcu insulin.  Blood sugar will be rechecked before breakfast.  Patient remains in the ED under IVC pending psychiatric evaluation and disposition.    ----------------------------------------- 5:35 AM on 03/29/2020 -----------------------------------------  Psychiatric NP and TTS attempted to interview patient again.  Patient remains angry and uncooperative, does not wish to speak with psychiatry at this time.    Irean Hong, MD 03/29/20 [phone]

## 2020-03-29 NOTE — BH Assessment (Signed)
Attempted to assess patient again withTTS, patient is currently not cooperative and refusing to participate in assessment. Patient did however report that she intentionally tried to hurt herself by taking too much Insulin.   Will attempt to assess patient at a later time

## 2020-03-29 NOTE — Plan of Care (Signed)
New admission  Problem: Education: Goal: Knowledge of [facility] Education information/materials will improve Outcome: Not Progressing Goal: Emotional status will improve Outcome: Not Progressing Goal: Mental status will improve Outcome: Not Progressing Goal: Verbalization of understanding the information provided will improve Outcome: Not Progressing   Problem: Activity: Goal: Interest or engagement in activities will improve Outcome: Not Progressing Goal: Sleeping patterns will improve Outcome: Not Progressing   Problem: Coping: Goal: Ability to verbalize frustrations and anger appropriately will improve Outcome: Not Progressing Goal: Ability to demonstrate self-control will improve Outcome: Not Progressing   Problem: Health Behavior/Discharge Planning: Goal: Identification of resources available to assist in meeting health care needs will improve Outcome: Not Progressing Goal: Compliance with treatment plan for underlying cause of condition will improve Outcome: Not Progressing   Problem: Physical Regulation: Goal: Ability to maintain clinical measurements within normal limits will improve Outcome: Not Progressing   Problem: Safety: Goal: Periods of time without injury will increase Outcome: Not Progressing   Problem: Education: Goal: Utilization of techniques to improve thought processes will improve Outcome: Not Progressing Goal: Knowledge of the prescribed therapeutic regimen will improve Outcome: Not Progressing   Problem: Activity: Goal: Interest or engagement in leisure activities will improve Outcome: Not Progressing Goal: Imbalance in normal sleep/wake cycle will improve Outcome: Not Progressing   Problem: Coping: Goal: Coping ability will improve Outcome: Not Progressing Goal: Will verbalize feelings Outcome: Not Progressing   Problem: Health Behavior/Discharge Planning: Goal: Ability to make decisions will improve Outcome: Not  Progressing Goal: Compliance with therapeutic regimen will improve Outcome: Not Progressing   Problem: Role Relationship: Goal: Will demonstrate positive changes in social behaviors and relationships Outcome: Not Progressing   Problem: Safety: Goal: Ability to disclose and discuss suicidal ideas will improve Outcome: Not Progressing Goal: Ability to identify and utilize support systems that promote safety will improve Outcome: Not Progressing   Problem: Self-Concept: Goal: Will verbalize positive feelings about self Outcome: Not Progressing Goal: Level of anxiety will decrease Outcome: Not Progressing   Problem: Education: Goal: Ability to make informed decisions regarding treatment will improve Outcome: Not Progressing   Problem: Coping: Goal: Coping ability will improve Outcome: Not Progressing   Problem: Health Behavior/Discharge Planning: Goal: Identification of resources available to assist in meeting health care needs will improve Outcome: Not Progressing   Problem: Medication: Goal: Compliance with prescribed medication regimen will improve Outcome: Not Progressing   Problem: Self-Concept: Goal: Ability to disclose and discuss suicidal ideas will improve Outcome: Not Progressing Goal: Will verbalize positive feelings about self Outcome: Not Progressing

## 2020-03-29 NOTE — Progress Notes (Addendum)
Inpatient Diabetes Program Recommendations  AACE/ADA: New Consensus Statement on Inpatient Glycemic Control (2015)  Target Ranges:  Prepandial:   less than 140 mg/dL      Peak postprandial:   less than 180 mg/dL (1-2 hours)      Critically ill patients:  140 - 180 mg/dL   Lab Results  Component Value Date   GLUCAP 293 (H) 03/29/2020   HGBA1C 7.4 (H) 03/28/2020    Review of Glycemic Control  Diabetes history: DM1 Outpatient Diabetes medications: Minimed Medtronin 670 G insulin pump Current orders for Inpatient glycemic control: Novolog 0-15 units q 4 hrs.  Inpatient Diabetes Program Recommendations:   Note from endocrinologist Dr. Tedd Sias on 12-19-19. "39 y.o. returns for follow-up for type 1 diabetes. She has a MiniMed Medtronic 670G pump with continuous glucose monitor (CGM). Her pump and sensor were downloaded and reviewed.  Basal rates 12 AM 1.15 unit/hour 6 AM 1.05 unit/hour 9 AM 1.0 unit/hr 24-hour total basal insulin 25.05 units  Bolus settings I:C ratio at 12 AM is 12, at 4 PM is 11 Sensitivity at 12 AM is 57 BG target at 12 AM is 115-125, target at 6 AM is 95-105 Insulin on board duration = 4 hrs"  While insulin pump off: -Lantus 20 units daily (80% home insulin pump dose) -Novolog 0-6 (1 unit per 12 gms carb) -Decrease Novolog correction to sensitive tid + hs 0-5 units Change diet to carb modified  Thank you, Darel Hong E. Karryn Kosinski, RN, MSN, CDE  Diabetes Coordinator Inpatient Glycemic Control Team Team Pager [phone] (8am-5pm) 03/29/2020 7:35 AM

## 2020-03-29 NOTE — BHH Suicide Risk Assessment (Signed)
[facility] Admission Suicide Risk Assessment   Nursing information obtained from:    Demographic factors:    Current Mental Status:    Loss Factors:    Historical Factors:    Risk Reduction Factors:     Total Time spent with patient: 45 minutes Principal Problem: <principal problem not specified> Diagnosis:  Active Problems:   Severe recurrent major depression without psychotic features (HCC)  Subjective Data: Robin Alvarez is a 39yo F with past psychiatric history of depressive disorder, who was admitted to [facility] unit due to worsened depression and s/p suicidal attempt via overdose on insulin in settings of alcohol intoxication.  Continued Clinical Symptoms:    The "Alcohol Use Disorders Identification Test", Guidelines for Use in Primary Care, Second Edition.  World Science writer [facility]). Score between 0-7:  no or low risk or alcohol related problems. Score between 8-15:  moderate risk of alcohol related problems. Score between 16-19:  high risk of alcohol related problems. Score 20 or above:  warrants further diagnostic evaluation for alcohol dependence and treatment.   CLINICAL FACTORS:   Depression:   Hopelessness Alcohol/Substance Abuse/Dependencies   COGNITIVE FEATURES THAT CONTRIBUTE TO RISK:  None    SUICIDE RISK:   Mild:  Suicidal ideation of limited frequency, intensity, duration, and specificity.  There are no identifiable plans, no associated intent, mild dysphoria and related symptoms, good self-control (both objective and subjective assessment), few other risk factors, and identifiable protective factors, including available and accessible social support.  Suicide Risk Assessment: recent suicidal attempt, h/o childhood trauma - represent non-modifiable/baseline risk factors. Presence of mental disorder, alcohol/substance use, hopelessness, impulsivity, living alone, debilitating medical illness, no current outpatient MH care - are dynamic risk factors. The patient denies  current suicidal thoughts, denies access to firearm. Patient is future-oriented, help-seeking - protective factors. Therefore, represents a low/moderate risk for harming self acutely and elevated chronic risk due to non-modifiable risk factors.    [place] Suicide Severity Rating Scale  Wish to be dead: Sometimes  Suicidal thoughts: Not now                  Suicidal thoughts with method: No                  Suicidal intent: Recent                  Suicide intent with specific plan: No                  Suicide behavior: recent  PLAN OF CARE:   Daily contact with patient to assess and evaluate symptoms and progress in treatment and Medication management  Patient will continue admission to the psychiatric unit; 15-minute checks; daily contact with patient to assess and evaluate symptoms and progress in treatment; psychoeducation. Patient restarted on Cymbalta 30mg  PO daily for the treatment of her depression and anxiety. Order also placed for as needed Hydroxyzine for anxiety and Trazodone for sleep.  Social worker to help patient set up with outpatient MH care.   I certify that inpatient services furnished can reasonably be expected to improve the patient's condition.   , MD 03/29/2020, 3:20 PM

## 2020-03-29 NOTE — Tx Team (Signed)
Initial Treatment Plan 03/29/2020 3:20 PM Robin Alvarez UYQ:[id number]    PATIENT STRESSORS: Health problems Loss of relationship  Marital or family conflict Substance abuse   PATIENT STRENGTHS: Ability for insight Average or above average intelligence Capable of independent living Licensed conveyancer Supportive family/friends   PATIENT IDENTIFIED PROBLEMS: Substance abuse   depression  Suicidal risk                  DISCHARGE CRITERIA:  Adequate post-discharge living arrangements Improved stabilization in mood, thinking, and/or behavior Reduction of life-threatening or endangering symptoms to within safe limits Verbal commitment to aftercare and medication compliance  PRELIMINARY DISCHARGE PLAN: Attend aftercare/continuing care group Outpatient therapy Placement in alternative living arrangements  PATIENT/FAMILY INVOLVEMENT: This treatment plan has been presented to and reviewed with the patient, Robin Alvarez patient has been given the opportunity to ask questions and make suggestions.  Chalmers Cater, RN 03/29/2020, 3:20 PM

## 2020-03-29 NOTE — ED Notes (Signed)
This RN to bedside, introduced self to patient. Pt awakens easily with this RN arrival to bedside. CBG obtained by this RN. Pt requesting diet drink at this time. Pt alert and oriented. Pt denies any further thoughts of wanting to hurt herself. This RN explained will return with insulin dose as ordered and drink for patient.

## 2020-03-29 NOTE — Progress Notes (Addendum)
Admission Note:   Pt is a 39 year old female, admitted to BMU from ED, status post suicide attempt, pt reports she turned the dial on her insulin pump to "carb 32" which in turn administered "4 units," while she was intoxicated last evening. Pt reports that this was not a serious attempt and that if it was she would administered more. Pt reports she is depressed, triggered by a break up with her significant other, then having to move into her mother's home, there she argued with her mother about her coming home late one night, pt was asked to leave, went to the [employer] with her dog to live, but due to her work her dog had to go to her friends home for care, reports that after losing her dog as a regular emotional support and companionship, her drinking increased and it all led per pt to her making the decision to overdose on her insulin. Pt denies current suicidal ideation at time of admission, denies homicidal ideation, denies hallucinations. Pt reports this is her first suicide attempt. 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 Pt is calm, cooperative, pleasant, alert and oriented to person, place, time and situation. Pt reports she is a type I diabetic, and her insulin pump is located on the unit in her locker, MD/psychiatrist placed pt instead on an sliding scale to cover her. Pt's skin check was completed by two RNs, and pt was noted to have bruises on her lower left leg, left knee and left upper thigh, and some abrasions on bilateral hands/fingers, reporting that the reason for this is she fell at home when she had her dog, the dog pulled her onto a slick spot on the road and she slipped and fell, denies that she falls without the dog. No distress noted, none reported, pt voices no complaints. Will continue to monitor pt per Q 15 minute face checks and monitor for safety and progress. Positive for alcohol on admission. CIWA = 4  at time of admission.

## 2020-03-29 NOTE — Progress Notes (Signed)
Pt has been calm, cooperative and med compliant. Torrie Mayers RN

## 2020-03-29 NOTE — BH Assessment (Signed)
Assessment Note  Robin Alvarez is an 39 y.o. female who presents to the ER due to taking an intentional overdose of her medications. She states, she was having thoughts of ending her life when she done it. She further explained, she was at work and was sent home because she was intoxicated from alcohol. Patient currently lives in a hotel and having a difficulty finding an apartment. She was living with her mother, but it didn't work because they didn't get alone. Prior to that, she was living with her fianc, until they ended the relationship.  During the interview, the patient was calm, cooperative and pleasant. She was able to provide appropriate answers to the questions. She denies history of violence and aggression. She admits to the use of alcohol and cannabis.  Diagnosis: Major Depression  Past Medical History:  Past Medical History:  Diagnosis Date  . Hyperlipidemia   . Type I (juvenile type) diabetes mellitus with hyperosmolarity, not stated as uncontrolled     History reviewed. No pertinent surgical history.  Family History:  Family History  Problem Relation Age of Onset  . Alcohol abuse Father   . Alcohol abuse Sister     Social History:  reports that she quit smoking about 9 years ago. Her smoking use included cigarettes. She started smoking about 25 years ago. She smoked 1.50 packs per day. She uses smokeless tobacco. She reports current alcohol use of about 20.0 standard drinks of alcohol per week. She reports current drug use. Frequency: 2.00 times per week. Drug: Marijuana.  Additional Social History:  Alcohol / Drug Use Pain Medications: See PTA Prescriptions: See PTA Over the Counter: See PTA History of alcohol / drug use?: Yes Longest period of sobriety (when/how long): Unable to quantify Substance #1 Name of Substance 1: Alcohol 1 - Last Use / Amount: 03/28/2020 Substance #2 Name of Substance 2: Cannabis 2 - Last Use / Amount: 03/28/2020  CIWA:  CIWA-Ar BP: 112/68 Pulse Rate: 78 COWS:    Allergies: No Known Allergies  Home Medications: (Not in a hospital admission)   OB/GYN Status:  No LMP recorded.  General Assessment Data Location of Assessment: [facility] TTS Assessment: In system Is this a Tele or Face-to-Face Assessment?: Face-to-Face Is this an Initial Assessment or a Re-assessment for this encounter?: Initial Assessment Patient Accompanied by:: N/A Language Other than English: No Living Arrangements: Other (Comment) What gender do you identify as?: Female Date Telepsych consult ordered in [facility]: 03/28/20 Time Telepsych consult ordered in [facility]: 1149 Marital status: Single Pregnancy Status: No Living Arrangements: Other (Comment) (Hotel) Can pt return to current living arrangement?: Yes Admission Status: Involuntary Petitioner: Police Is patient capable of signing voluntary admission?: No Referral Source: Self/Family/Friend Insurance type: [employer]  Medical Screening Exam [facility] Walk-in ONLY) Medical Exam completed: Yes  Crisis Care Plan Living Arrangements: Other (Comment) [employer]) Legal Guardian: Other: (Self) Name of Psychiatrist: Reports of none Name of Therapist: Reports of none  Education Status Is patient currently in school?: No Is the patient employed, unemployed or receiving disability?: Employed  Risk to self with the past 6 months Suicidal Ideation: Yes-Currently Present Has patient been a risk to self within the past 6 months prior to admission? : Yes Suicidal Intent: Yes-Currently Present Has patient had any suicidal intent within the past 6 months prior to admission? : Yes Is patient at risk for suicide?: Yes Suicidal Plan?: Yes-Currently Present Has patient had any suicidal plan within the past 6 months prior to admission? :  Yes Specify Current Suicidal Plan: Overdose on Insulin Access to Means: Yes Specify Access to Suicidal Means: Overdose on insulin What has been your use of  drugs/alcohol within the last 12 months?: Alcohol Previous Attempts/Gestures: No How many times?: 1 Other Self Harm Risks: Reports of none Triggers for Past Attempts: None known Intentional Self Injurious Behavior: None Family Suicide History: Unknown Recent stressful life event(s): Conflict (Comment), Loss (Comment), Trauma (Comment) Persecutory voices/beliefs?: No Depression: Yes Depression Symptoms: Tearfulness, Isolating, Feeling worthless/self pity Substance abuse history and/or treatment for substance abuse?: Yes Suicide prevention information given to non-admitted patients: Not applicable  Risk to Others within the past 6 months Homicidal Ideation: No Does patient have any lifetime risk of violence toward others beyond the six months prior to admission? : No Thoughts of Harm to Others: No Current Homicidal Intent: No Current Homicidal Plan: No Access to Homicidal Means: No Identified Victim: Reports of none History of harm to others?: No Assessment of Violence: None Noted Violent Behavior Description: Reports of none Does patient have access to weapons?: No Criminal Charges Pending?: No Does patient have a court date: No Is patient on probation?: No  Psychosis Hallucinations: None noted Delusions: None noted  Mental Status Report Appearance/Hygiene: Unremarkable, In scrubs Eye Contact: Good Motor Activity: Freedom of movement, Unremarkable Speech: Logical/coherent, Unremarkable Level of Consciousness: Alert Mood: Depressed, Sad, Pleasant Affect: Appropriate to circumstance, Depressed, Sad Anxiety Level: Minimal Thought Processes: Coherent, Relevant Judgement: Unimpaired Orientation: Person, Place, Time, Situation, Appropriate for developmental age Obsessive Compulsive Thoughts/Behaviors: None  Cognitive Functioning Concentration: Normal Memory: Recent Intact, Remote Intact Is patient IDD: No Insight: Fair Impulse Control: Fair Appetite: Good Have you had  any weight changes? : No Change Sleep: No Change Total Hours of Sleep: 8 Vegetative Symptoms: None  ADLScreening [facility] Assessment Services) Patient's cognitive ability adequate to safely complete daily activities?: Yes Patient able to express need for assistance with ADLs?: Yes Independently performs ADLs?: Yes (appropriate for developmental age)  Prior Inpatient Therapy Prior Inpatient Therapy: No  Prior Outpatient Therapy Prior Outpatient Therapy: Yes Prior Therapy Dates: 2015-06/2019 Prior Therapy Facilty/Provider(s): Private Practice Reason for Treatment: Depression Does patient have an ACCT team?: No Does patient have Intensive In-House Services?  : No Does patient have [facility] services? : No Does patient have P4CC services?: No  ADL Screening (condition at time of admission) Patient's cognitive ability adequate to safely complete daily activities?: Yes Is the patient deaf or have difficulty hearing?: No Does the patient have difficulty seeing, even when wearing glasses/contacts?: No Does the patient have difficulty concentrating, remembering, or making decisions?: No Patient able to express need for assistance with ADLs?: Yes Does the patient have difficulty dressing or bathing?: No Independently performs ADLs?: Yes (appropriate for developmental age) Does the patient have difficulty walking or climbing stairs?: No Weakness of Legs: None Weakness of Arms/Hands: None  Home Assistive Devices/Equipment Home Assistive Devices/Equipment: None  Therapy Consults (therapy consults require a physician order) PT Evaluation Needed: No OT Evalulation Needed: No SLP Evaluation Needed: No Abuse/Neglect Assessment (Assessment to be complete while patient is alone) Abuse/Neglect Assessment Can Be Completed: Yes Physical Abuse: Denies Verbal Abuse: Denies Sexual Abuse: Denies Exploitation of patient/patient's resources: Denies Self-Neglect: Denies Values / Beliefs Cultural  Requests During Hospitalization: None Spiritual Requests During Hospitalization: None Consults Spiritual Care Consult Needed: No Transition of Care Team Consult Needed: No Advance Directives (For Healthcare) Does Patient Have a Medical Advance Directive?: No Would patient like information on creating a medical advance directive?: No -  Patient declined  Disposition:  Disposition Initial Assessment Completed for this Encounter: Yes  On Site Evaluation by:   Reviewed with Physician:    Lilyan Gilford MS, LCAS, [facility], [facility] Therapeutic Triage Specialist 03/29/2020 1:17 PM

## 2020-03-29 NOTE — [facility] Group Notes (Signed)
[facility] Group Notes: (Clinical Social Work)   03/29/2020      Type of Therapy:  Group Therapy   Participation Level:  Did Not Attend - was invited individually by Nurse/MHT and chose not to attend.   Susa Simmonds, LCSWA 03/29/2020  3:09 PM

## 2020-03-30 LAB — GLUCOSE, CAPILLARY
Glucose-Capillary: 108 mg/dL — ABNORMAL HIGH (ref 70–99)
Glucose-Capillary: 152 mg/dL — ABNORMAL HIGH (ref 70–99)
Glucose-Capillary: 154 mg/dL — ABNORMAL HIGH (ref 70–99)
Glucose-Capillary: 188 mg/dL — ABNORMAL HIGH (ref 70–99)
Glucose-Capillary: 324 mg/dL — ABNORMAL HIGH (ref 70–99)
Glucose-Capillary: 388 mg/dL — ABNORMAL HIGH (ref 70–99)

## 2020-03-30 NOTE — Progress Notes (Signed)
Patient alert and oriented x 4, affect is flat but she brightens upon approach, she was noted isolated to her room except during snack and medication time, she currently denies SI/HI/AVH, patient appears less anxious, she is complaint with medication regimen. 15 minutes safety checks maintained, will continue to closely monitor.

## 2020-03-30 NOTE — [facility] Group Notes (Signed)
LCSW Group Therapy Note   03/30/2020 1:00 PM- 2:03 PM    Type of Therapy and Topic: Group Therapy: Fears and Unhealthy Coping Skills   Participation Level: Active   Description of Group: The focus of this group was to discuss some of the prevalent fears that patients experience, and to identify the commonalities among group members. An exercise was used to initiate the discussion, followed by writing on the white board a group-generated list of unhealthy coping and healthy coping techniques to deal with each fear.   Therapeutic Goals: 1. Patient will identify and describe 3 fears they experience 2. Patient will identify one positive coping strategy for each fear they experience 3. Patient will respond empathically to peers statements regarding fears they experience   Summary of Patient Progress: Patient checked into group feeling better than she was yesterday. Patient stated that she is feeling more hopeful. Patient created a vision board of her goals and plans for discharge. Patient stated that she does have a fear of letting her mother know she is in the hospital. Patient stated she has decided not to tell her mother why she was in the hospital. Patient stated that her mom will just say what did you do and did you take your medication. Patient stated that she would like to practice more self-care, create boundaries with her mother, continue to be social with her friends, and possibly speak to a therapist.     Therapeutic Modalities Cognitive Behavioral Therapy Motivational Interviewing    Susa Simmonds, [place] 03/30/2020 3:57 PM

## 2020-03-30 NOTE — Progress Notes (Signed)
Inpatient Diabetes Program Recommendations  AACE/ADA: New Consensus Statement on Inpatient Glycemic Control (2015)  Target Ranges:  Prepandial:   less than 140 mg/dL      Peak postprandial:   less than 180 mg/dL (1-2 hours)      Critically ill patients:  140 - 180 mg/dL   Lab Results  Component Value Date   GLUCAP 108 (H) 03/30/2020   HGBA1C 7.4 (H) 03/28/2020    Review of Glycemic Control  Diabetes history: DM1 Outpatient Diabetes medications: Minimed Medtronin 670 G insulin pump Current orders for Inpatient glycemic control: Novolog 0-24 units q 4 hrs.  Inpatient Diabetes Program Recommendations:   Note from endocrinologist Dr. Tedd Sias on 12-19-19. "39 y.o. returns for follow-up for type 1 diabetes. She has a MiniMed Medtronic 670G pump with continuous glucose monitor (CGM). Her pump and sensor were downloaded and reviewed.  Basal rates 12 AM 1.15 unit/hour 6 AM 1.05 unit/hour 9 AM 1.0 unit/hr 24-hour total basal insulin 25.05 units  Bolus settings I:C ratio at 12 AM is 12, at 4 PM is 11 Sensitivity at 12 AM is 57 BG target at 12 AM is 115-125, target at 6 AM is 95-105 Insulin on board duration = 4 hrs"  While insulin pump off: -Lantus 20 units daily (80% home insulin pump dose) -Novolog 5 units tid meal coverage if eats 50% -Decrease Novolog correction to sensitive tid + hs 0-5 units Change diet to carb modified Secure chat sent to Dr. Toni Amend.  Thank you, [name]. Kindra Bickham, RN, MSN, CDE  Diabetes Coordinator Inpatient Glycemic Control Team Team Pager [phone] (8am-5pm) 03/30/2020 4:20 PM

## 2020-03-30 NOTE — Progress Notes (Signed)
[facility] MD Progress Note  03/30/2020 10:54 AM Robin Alvarez  MRN:  [id number]   Mr. Robin Alvarez is a 39yo F with past psychiatric history of depressive disorder, who was admitted to [facility] unit due to worsened depression and s/p suicidal attempt via overdose on insulin in settings of alcohol intoxication.  Interval History Patient was seen today for re-evaluation.  Nursing reports no events overnight. The patient has no issues with performing ADLs.  Patient has been medication compliant.    Subjective:  On assessment patient reports "feeling better today" - reports she is less depressed, slept better and her anxiety is also better managed with with "as needed" medications. She denies current thoughts of suicide as well as any plans. She reports she is planning to participate in some groups today and "get something useful". She is oriented for future, reports she is more hopeful now and says she is willing to "get back on track". She is interested in both, outpatient psychiatrist and psyhotherapy and hopes the SW will connect her with an office that accepts her current insurance.  Her blood sugars are controlled on insulin sliding scale. She denies any current physical complaints.   Current suicidal/homicidal ideations: Denies Current auditory/visual hallucinations: Denies The patient reports no side effects from medications.    Lat CIWA was 4 on 03/29/20.  Labs: CBG (last 3)  Recent Labs    03/30/20 0021 03/30/20 0432 03/30/20 0743  GLUCAP 188* 324* 152*    Principal Problem: <principal problem not specified> Diagnosis: Active Problems:   Severe recurrent major depression without psychotic features (HCC)  Total Time spent with patient: 30 minutes  Past Psychiatric History: see H&P  Past Medical History:  Past Medical History:  Diagnosis Date   Hyperlipidemia    Type I (juvenile type) diabetes mellitus with hyperosmolarity, not stated as uncontrolled    History reviewed. No  pertinent surgical history. Family History:  Family History  Problem Relation Age of Onset   Alcohol abuse Father    Alcohol abuse Sister    Family Psychiatric  History: see H&P    Social History:  Social History   Substance and Sexual Activity  Alcohol Use Yes   Alcohol/week: 20.0 standard drinks   Types: 20 Standard drinks or equivalent per week     Social History   Substance and Sexual Activity  Drug Use Yes   Frequency: 2.0 times per week   Types: Marijuana    Social History   Socioeconomic History   Marital status: Unknown    Spouse name: Not on file   Number of children: Not on file   Years of education: Not on file   Highest education level: Not on file  Occupational History   Not on file  Tobacco Use   Smoking status: Former Smoker    Packs/day: 1.50    Types: Cigarettes    Start date: 08/08/1994    Quit date: 08/08/2010    Years since quitting: 9.6   Smokeless tobacco: Current User   Tobacco comment: vapor  Substance and Sexual Activity   Alcohol use: Yes    Alcohol/week: 20.0 standard drinks    Types: 20 Standard drinks or equivalent per week   Drug use: Yes    Frequency: 2.0 times per week    Types: Marijuana   Sexual activity: Not Currently  Other Topics Concern   Not on file  Social History Narrative   Not on file   Social Determinants of Health   Financial Resource  Strain:    Difficulty of Paying Living Expenses: Not on file  Food Insecurity:    Worried About Running Out of Food in the Last Year: Not on file   Ran Out of Food in the Last Year: Not on file  Transportation Needs:    Lack of Transportation (Medical): Not on file   Lack of Transportation (Non-Medical): Not on file  Physical Activity:    Days of Exercise per Week: Not on file   Minutes of Exercise per Session: Not on file  Stress:    Feeling of Stress : Not on file  Social Connections:    Frequency of Communication with Friends and Family:  Not on file   Frequency of Social Gatherings with Friends and Family: Not on file   Attends Religious Services: Not on file   Active Member of Clubs or Organizations: Not on file   Attends BankerClub or Organization Meetings: Not on file   Marital Status: Not on file   Additional Social History:                         Sleep: Fair  Appetite:  Good  Current Medications: Current Facility-Administered Medications  Medication Dose Route Frequency Provider Last Rate Last Admin   acetaminophen (TYLENOL) tablet 650 mg  650 mg Oral Q6H PRN Clapacs, John T, MD       alum & mag hydroxide-simeth (MAALOX/MYLANTA) 200-200-20 MG/5ML suspension 30 mL  30 mL Oral Q4H PRN Clapacs, John T, MD       DULoxetine (CYMBALTA) DR capsule 30 mg  30 mg Oral Daily Draven Laine, MD   30 mg at 03/30/20 0846   fluticasone (FLONASE) 50 MCG/ACT nasal spray 1 spray  1 spray Each Nare q morning - 10a Khristian Phillippi, MD   1 spray at 03/30/20 0900   hydrOXYzine (ATARAX/VISTARIL) tablet 50 mg  50 mg Oral TID PRN Clapacs, John T, MD   50 mg at 03/30/20 1016   insulin aspart (novoLOG) injection 0-24 Units  0-24 Units Subcutaneous Q4H Shaquilla Kehres, Serina CowperAlisa, MD   2 Units at 03/30/20 0842   magnesium hydroxide (MILK OF MAGNESIA) suspension 30 mL  30 mL Oral Daily PRN Clapacs, John T, MD       pantoprazole (PROTONIX) EC tablet 40 mg  40 mg Oral Daily Dalores Weger, Serina CowperAlisa, MD   40 mg at 03/30/20 0846   traZODone (DESYREL) tablet 100 mg  100 mg Oral QHS PRN Clapacs, Jackquline DenmarkJohn T, MD   100 mg at 03/29/20 2155    Lab Results:  Results for orders placed or performed during the hospital encounter of 03/29/20 (from the past 48 hour(s))  Glucose, capillary     Status: Abnormal   Collection Time: 03/29/20 12:46 PM  Result Value Ref Range   Glucose-Capillary 311 (H) 70 - 99 mg/dL    Comment: Glucose reference range applies only to samples taken after fasting for at least 8 hours.  Glucose, capillary     Status: Abnormal   Collection Time:  03/29/20  4:21 PM  Result Value Ref Range   Glucose-Capillary 270 (H) 70 - 99 mg/dL    Comment: Glucose reference range applies only to samples taken after fasting for at least 8 hours.  Glucose, capillary     Status: None   Collection Time: 03/29/20  8:35 PM  Result Value Ref Range   Glucose-Capillary 97 70 - 99 mg/dL    Comment: Glucose reference range applies only to samples  taken after fasting for at least 8 hours.  Glucose, capillary     Status: Abnormal   Collection Time: 03/30/20 12:21 AM  Result Value Ref Range   Glucose-Capillary 188 (H) 70 - 99 mg/dL    Comment: Glucose reference range applies only to samples taken after fasting for at least 8 hours.   Comment 1 Notify RN   Glucose, capillary     Status: Abnormal   Collection Time: 03/30/20  4:32 AM  Result Value Ref Range   Glucose-Capillary 324 (H) 70 - 99 mg/dL    Comment: Glucose reference range applies only to samples taken after fasting for at least 8 hours.   Comment 1 Notify RN   Glucose, capillary     Status: Abnormal   Collection Time: 03/30/20  7:43 AM  Result Value Ref Range   Glucose-Capillary 152 (H) 70 - 99 mg/dL    Comment: Glucose reference range applies only to samples taken after fasting for at least 8 hours.   Comment 1 Notify RN    Comment 2 Document in Chart     Blood Alcohol level:  Lab Results  Component Value Date   ETH 254 (H) 03/28/2020    Metabolic Disorder Labs: Lab Results  Component Value Date   HGBA1C 7.4 (H) 03/28/2020   MPG 165.68 03/28/2020   No results found for: PROLACTIN No results found for: CHOL, TRIG, HDL, CHOLHDL, VLDL, LDLCALC  Physical Findings: AIMS:  , ,  ,  ,    CIWA:  CIWA-Ar Total: 1 COWS:     Musculoskeletal: Strength & Muscle Tone: within normal limits Gait & Station: normal Patient leans: N/A  Psychiatric Specialty Exam: Physical Exam  Review of Systems  Blood pressure 131/74, pulse 74, temperature 98 F (36.7 C), temperature source Oral, resp.  rate 18, height 5\' 4"  (1.626 m), weight 77.1 kg, SpO2 99 %.Body mass index is 29.18 kg/m.  General Appearance: Casual  Eye Contact:  Good  Speech:  Clear and Coherent and Normal Rate  Volume:  Normal  Mood:  "better; less depressed, less anxious"  Affect:  Congruent and Constricted  Thought Process:  Coherent, Goal Directed and Linear  Orientation:  Full (Time, Place, and Person)  Thought Content:  Logical  Suicidal Thoughts:  No  Homicidal Thoughts:  No  Memory:  Immediate;   Fair Recent;   Fair Remote;   Fair  Judgement:  Fair  Insight:  Fair  Psychomotor Activity:  Normal  Concentration:  Concentration: Good and Attention Span: Good  Recall:  Good  Fund of Knowledge:  Good  Language:  Good  Akathisia:  No  Handed:  Right  AIMS (if indicated):     Assets:  Communication Skills Desire for Improvement Financial Resources/Insurance Social Support  ADL's:  Intact  Cognition:  WNL  Sleep:        Treatment Plan Summary: Daily contact with patient to assess and evaluate symptoms and progress in treatment and Medication management  65.-year-old female with severe major depression recurrent alcohol abuse, s/p suicide attempt.  Patient reports partial mood improvement after she was restarted on medications yesterday. Will continue current med combination without  Changes today.  Plan: - continue admission to the psychiatric unit; 15-minute checks; daily contact with patient to assess and evaluate symptoms and progress in treatment; psychoeducation. -contunue Cymbalta 30mg  PO daily for the treatment of her depression and anxiety. -continue as needed Hydroxyzine for anxiety and Trazodone for sleep. -due to the history of alcohol abuse,  patient will be monitored on CIWA for the symptoms of acute withdrawal. Last was 4. -for the management of diabetes she will be continued on Insulin sliding scale; the Diabetes coordinator consult ordered and we will follow their recommendations  about  insulin management while she is on the unit.  -EKG from 11/13 showed QTc with NSR. -Social worker to help patient set up with outpatient MH care.  -Disposition: Estimated duration of hospitalization: midweek next week. All necessary aftercare will be arranged prior to discharge Likely d/c home with outpatient psych follow-up.  -  I certify that the patient does need, on a daily basis, active treatment furnished directly by or requiring the supervision of inpatient psychiatric facility personnel.   Thalia Party, MD 03/30/2020, 10:54 AM

## 2020-03-30 NOTE — Progress Notes (Signed)
Patient came to the nurse's station asking for her blood sugar to be taken, "I think it's high". Patient's CBG was 388. This Clinical research associate administered 20 units of Novolog, patient tolerated administration well.

## 2020-03-30 NOTE — Plan of Care (Signed)
D- Patient alert and oriented. Patient presents in a pleasant mood on assessment reporting that she slept "fair" last night and had no complaints to voice to this Clinical research associate. Patient endorsed both depression/anxiety stating that she is "stressing about what's going to happen when I leave" and "I'm supposed to be moving into a new apartment tomorrow", which is causing her to be a bit overwhelmed. Patient denies SI, HI, AVH, and pain at this time. Patient's goal for today is to "set up therapy for future", in which she will "attend group", in order to achieve her goal.  A- Scheduled medications administered to patient, per MD orders. Support and encouragement provided.  Routine safety checks conducted every 15 minutes.  Patient informed to notify staff with problems or concerns.  R- No adverse drug reactions noted. Patient contracts for safety at this time. Patient compliant with medications and treatment plan. Patient receptive, calm, and cooperative. Patient interacts well with others on the unit.  Patient remains safe at this time.  Problem: Education: Goal: Knowledge of [facility] Education information/materials will improve Outcome: Progressing Goal: Emotional status will improve Outcome: Progressing Goal: Mental status will improve Outcome: Progressing Goal: Verbalization of understanding the information provided will improve Outcome: Progressing   Problem: Activity: Goal: Interest or engagement in activities will improve Outcome: Progressing Goal: Sleeping patterns will improve Outcome: Progressing   Problem: Coping: Goal: Ability to verbalize frustrations and anger appropriately will improve Outcome: Progressing Goal: Ability to demonstrate self-control will improve Outcome: Progressing   Problem: Health Behavior/Discharge Planning: Goal: Identification of resources available to assist in meeting health care needs will improve Outcome: Progressing Goal: Compliance with  treatment plan for underlying cause of condition will improve Outcome: Progressing   Problem: Physical Regulation: Goal: Ability to maintain clinical measurements within normal limits will improve Outcome: Progressing   Problem: Safety: Goal: Periods of time without injury will increase Outcome: Progressing   Problem: Education: Goal: Utilization of techniques to improve thought processes will improve Outcome: Progressing Goal: Knowledge of the prescribed therapeutic regimen will improve Outcome: Progressing   Problem: Activity: Goal: Interest or engagement in leisure activities will improve Outcome: Progressing Goal: Imbalance in normal sleep/wake cycle will improve Outcome: Progressing   Problem: Coping: Goal: Coping ability will improve Outcome: Progressing Goal: Will verbalize feelings Outcome: Progressing   Problem: Health Behavior/Discharge Planning: Goal: Ability to make decisions will improve Outcome: Progressing Goal: Compliance with therapeutic regimen will improve Outcome: Progressing   Problem: Role Relationship: Goal: Will demonstrate positive changes in social behaviors and relationships Outcome: Progressing   Problem: Safety: Goal: Ability to disclose and discuss suicidal ideas will improve Outcome: Progressing Goal: Ability to identify and utilize support systems that promote safety will improve Outcome: Progressing   Problem: Self-Concept: Goal: Will verbalize positive feelings about self Outcome: Progressing Goal: Level of anxiety will decrease Outcome: Progressing   Problem: Education: Goal: Ability to make informed decisions regarding treatment will improve Outcome: Progressing   Problem: Coping: Goal: Coping ability will improve Outcome: Progressing   Problem: Health Behavior/Discharge Planning: Goal: Identification of resources available to assist in meeting health care needs will improve Outcome: Progressing   Problem:  Medication: Goal: Compliance with prescribed medication regimen will improve Outcome: Progressing   Problem: Self-Concept: Goal: Ability to disclose and discuss suicidal ideas will improve Outcome: Progressing Goal: Will verbalize positive feelings about self Outcome: Progressing

## 2020-03-31 LAB — GLUCOSE, CAPILLARY
Glucose-Capillary: 135 mg/dL — ABNORMAL HIGH (ref 70–99)
Glucose-Capillary: 204 mg/dL — ABNORMAL HIGH (ref 70–99)
Glucose-Capillary: 266 mg/dL — ABNORMAL HIGH (ref 70–99)
Glucose-Capillary: 280 mg/dL — ABNORMAL HIGH (ref 70–99)

## 2020-03-31 MED ORDER — TRAZODONE HCL 100 MG PO TABS: 100.0000 mg | ORAL_TABLET | Freq: Every evening | ORAL | 1 refills | Status: DC | PRN

## 2020-03-31 MED ORDER — DULOXETINE HCL 30 MG PO CPEP: 30.0000 mg | ORAL_CAPSULE | Freq: Every day | ORAL | 1 refills | Status: DC

## 2020-03-31 MED ORDER — HYDROXYZINE HCL 50 MG PO TABS: 50.0000 mg | ORAL_TABLET | Freq: Three times a day (TID) | ORAL | 1 refills | Status: DC | PRN

## 2020-03-31 MED ORDER — INSULIN ASPART 100 UNIT/ML ~~LOC~~ SOLN
0.0000 [IU] | Freq: Three times a day (TID) | SUBCUTANEOUS | Status: DC
  Administered 2020-03-31: 3 [IU] via SUBCUTANEOUS
  Filled 2020-03-31: qty 1

## 2020-03-31 MED ORDER — INSULIN ASPART 100 UNIT/ML ~~LOC~~ SOLN
5.0000 [IU] | Freq: Three times a day (TID) | SUBCUTANEOUS | Status: DC
  Administered 2020-03-31: 5 [IU] via SUBCUTANEOUS
  Filled 2020-03-31: qty 1

## 2020-03-31 MED ORDER — INSULIN ASPART 100 UNIT/ML ~~LOC~~ SOLN: 0.0000 [IU] | Freq: Every day | SUBCUTANEOUS | Status: DC

## 2020-03-31 MED ORDER — INSULIN GLARGINE 100 UNIT/ML ~~LOC~~ SOLN
20.0000 [IU] | Freq: Once | SUBCUTANEOUS | Status: AC
  Administered 2020-03-31: 20 [IU] via SUBCUTANEOUS
  Filled 2020-03-31: qty 0.2

## 2020-03-31 NOTE — Progress Notes (Signed)
Recreation Therapy Notes   Date: 03/31/2020  Time: 9:30 am   Location: Craft room  Behavioral response: Appropriate  Intervention Topic: Stress Management    Discussion/Intervention:  Group content on today was focused on stress. The group defined stress and way to cope with stress. Participants expressed how they know when they are stresses out. Individuals described the different ways they have to cope with stress. The group stated reasons why it is important to cope with stress. Patient explained what good stress is and some examples. The group participated in the intervention "Stress Management". Individuals were separated into two group and answered questions related to stress.  Clinical Observations/Feedback: Patient came to group and expressed that she clean when she is stressed. She identified hyperventilation as a warning sign she is stressed. Individual was social with peers and staff while participating in the intervention.  Michaelanthony Kempton LRT/CTRS         Gregory Barrick 03/31/2020 2:13 PM

## 2020-03-31 NOTE — Progress Notes (Signed)
Blood sugar level 280. 12 units administered.       Cleo Butler-Nicholson, LPN

## 2020-03-31 NOTE — [facility] Group Notes (Signed)
LCSW Group Therapy Note   03/31/2020 12:43 PM  Type of Therapy and Topic:  Group Therapy:  Overcoming Obstacles   Participation Level:  Active   Description of Group:    In this group patients will be encouraged to explore what they see as obstacles to their own wellness and recovery. They will be guided to discuss their thoughts, feelings, and behaviors related to these obstacles. The group will process together ways to cope with barriers, with attention given to specific choices patients can make. Each patient will be challenged to identify changes they are motivated to make in order to overcome their obstacles. This group will be process-oriented, with patients participating in exploration of their own experiences as well as giving and receiving support and challenge from other group members.   Therapeutic Goals: 1. Patient will identify personal and current obstacles as they relate to admission. 2. Patient will identify barriers that currently interfere with their wellness or overcoming obstacles.  3. Patient will identify feelings, thought process and behaviors related to these barriers. 4. Patient will identify two changes they are willing to make to overcome these obstacles:      Summary of Patient Progress Patient was present in group.  Patient shared that a barrier that she has faces has been her relationship with her mother.  Patient was unable to participate further as she was discharged.     Therapeutic Modalities:   Cognitive Behavioral Therapy Solution Focused Therapy Motivational Interviewing Relapse Prevention Therapy  Penni Homans, MSW, LCSW 03/31/2020 12:43 PM

## 2020-03-31 NOTE — Tx Team (Addendum)
Interdisciplinary Treatment and Diagnostic Plan Update  03/31/2020 Time of Session: 9:00AM Robin Alvarez MRN: [id number]  Principal Diagnosis: <principal problem not specified>  Secondary Diagnoses: Active Problems:   Severe recurrent major depression without psychotic features (HCC)   Current Medications:  Current Facility-Administered Medications  Medication Dose Route Frequency Provider Last Rate Last Admin  . acetaminophen (TYLENOL) tablet 650 mg  650 mg Oral Q6H PRN Clapacs, John T, MD      . alum & mag hydroxide-simeth (MAALOX/MYLANTA) 200-200-20 MG/5ML suspension 30 mL  30 mL Oral Q4H PRN Clapacs, John T, MD      . DULoxetine (CYMBALTA) DR capsule 30 mg  30 mg Oral Daily Thalia Party, MD   30 mg at 03/31/20 0742  . fluticasone (FLONASE) 50 MCG/ACT nasal spray 1 spray  1 spray Each Nare q morning - 10a Paliy, Alisa, MD   1 spray at 03/31/20 0841  . hydrOXYzine (ATARAX/VISTARIL) tablet 50 mg  50 mg Oral TID PRN Clapacs, Jackquline Denmark, MD   50 mg at 03/31/20 0841  . insulin aspart (novoLOG) injection 0-5 Units  0-5 Units Subcutaneous QHS Jesse Sans, MD      . insulin aspart (novoLOG) injection 0-9 Units  0-9 Units Subcutaneous TID WC Jesse Sans, MD      . insulin aspart (novoLOG) injection 5 Units  5 Units Subcutaneous TID WC Jesse Sans, MD      . insulin glargine (LANTUS) injection 20 Units  20 Units Subcutaneous Once Jesse Sans, MD      . magnesium hydroxide (MILK OF MAGNESIA) suspension 30 mL  30 mL Oral Daily PRN Clapacs, John T, MD      . pantoprazole (PROTONIX) EC tablet 40 mg  40 mg Oral Daily Thalia Party, MD   40 mg at 03/31/20 0742  . traZODone (DESYREL) tablet 100 mg  100 mg Oral QHS PRN Clapacs, Jackquline Denmark, MD   100 mg at 03/30/20 2116   PTA Medications: Medications Prior to Admission  Medication Sig Dispense Refill Last Dose  . insulin aspart (NOVOLOG) 100 UNIT/ML injection Inject 0-110 Units into the skin as directed. Use up to 110 units via insulin  pump     . norethindrone-ethinyl estradiol 1/35 (PIRMELLA 1/35) tablet Take 1 tablet by mouth daily.       Patient Stressors: Health problems Loss of relationship  Marital or family conflict Substance abuse  Patient Strengths: Ability for insight Average or above average intelligence Capable of independent living Licensed conveyancer Supportive family/friends  Treatment Modalities: Medication Management, Group therapy, Case management,  1 to 1 session with clinician, Psychoeducation, Recreational therapy.   Physician Treatment Plan for Primary Diagnosis: <principal problem not specified> Long Term Goal(s): Improvement in symptoms so as ready for discharge Improvement in symptoms so as ready for discharge   Short Term Goals: Ability to identify changes in lifestyle to reduce recurrence of condition will improve Ability to verbalize feelings will improve Ability to disclose and discuss suicidal ideas Ability to demonstrate self-control will improve Ability to identify and develop effective coping behaviors will improve Ability to maintain clinical measurements within normal limits will improve Compliance with prescribed medications will improve Ability to identify triggers associated with substance abuse/mental health issues will improve Ability to identify changes in lifestyle to reduce recurrence of condition will improve Ability to verbalize feelings will improve Ability to disclose and discuss suicidal ideas Ability to demonstrate self-control will improve Ability to identify and develop effective coping behaviors  will improve Ability to maintain clinical measurements within normal limits will improve Compliance with prescribed medications will improve Ability to identify triggers associated with substance abuse/mental health issues will improve  Medication Management: Evaluate patient's response, side effects, and tolerance of medication  regimen.  Therapeutic Interventions: 1 to 1 sessions, Unit Group sessions and Medication administration.  Evaluation of Outcomes: Adequate for Discharge  Physician Treatment Plan for Secondary Diagnosis: Active Problems:   Severe recurrent major depression without psychotic features (HCC)  Long Term Goal(s): Improvement in symptoms so as ready for discharge Improvement in symptoms so as ready for discharge   Short Term Goals: Ability to identify changes in lifestyle to reduce recurrence of condition will improve Ability to verbalize feelings will improve Ability to disclose and discuss suicidal ideas Ability to demonstrate self-control will improve Ability to identify and develop effective coping behaviors will improve Ability to maintain clinical measurements within normal limits will improve Compliance with prescribed medications will improve Ability to identify triggers associated with substance abuse/mental health issues will improve Ability to identify changes in lifestyle to reduce recurrence of condition will improve Ability to verbalize feelings will improve Ability to disclose and discuss suicidal ideas Ability to demonstrate self-control will improve Ability to identify and develop effective coping behaviors will improve Ability to maintain clinical measurements within normal limits will improve Compliance with prescribed medications will improve Ability to identify triggers associated with substance abuse/mental health issues will improve     Medication Management: Evaluate patient's response, side effects, and tolerance of medication regimen.  Therapeutic Interventions: 1 to 1 sessions, Unit Group sessions and Medication administration.  Evaluation of Outcomes: Adequate for Discharge   RN Treatment Plan for Primary Diagnosis: <principal problem not specified> Long Term Goal(s): Knowledge of disease and therapeutic regimen to maintain health will improve  Short Term  Goals: Ability to demonstrate self-control, Ability to participate in decision making will improve, Ability to verbalize feelings will improve, Ability to disclose and discuss suicidal ideas, Ability to identify and develop effective coping behaviors will improve and Compliance with prescribed medications will improve  Medication Management: RN will administer medications as ordered by provider, will assess and evaluate patient's response and provide education to patient for prescribed medication. RN will report any adverse and/or side effects to prescribing provider.  Therapeutic Interventions: 1 on 1 counseling sessions, Psychoeducation, Medication administration, Evaluate responses to treatment, Monitor vital signs and CBGs as ordered, Perform/monitor CIWA, COWS, AIMS and Fall Risk screenings as ordered, Perform wound care treatments as ordered.  Evaluation of Outcomes: Adequate for Discharge   LCSW Treatment Plan for Primary Diagnosis: <principal problem not specified> Long Term Goal(s): Safe transition to appropriate next level of care at discharge, Engage patient in therapeutic group addressing interpersonal concerns.  Short Term Goals: Engage patient in aftercare planning with referrals and resources, Increase social support, Increase ability to appropriately verbalize feelings, Increase emotional regulation and Facilitate acceptance of mental health diagnosis and concerns  Therapeutic Interventions: Assess for all discharge needs, 1 to 1 time with Social worker, Explore available resources and support systems, Assess for adequacy in community support network, Educate family and significant other(s) on suicide prevention, Complete Psychosocial Assessment, Interpersonal group therapy.  Evaluation of Outcomes: Adequate for Discharge   Progress in Treatment: Attending groups: Yes. Participating in groups: Yes. Taking medication as prescribed: Yes. Toleration medication:  Yes. Family/Significant other contact made: No, will contact:  pt declined permission Patient understands diagnosis: Yes. Discussing patient identified problems/goals with staff: Yes. Medical problems stabilized  or resolved: Yes. Denies suicidal/homicidal ideation: Yes. Issues/concerns per patient self-inventory: No. Other: none  New problem(s) identified: No, Describe:  none  New Short Term/Long Term Goal(s): medication management for mood stabilization; elimination of SI thoughts; development of comprehensive mental wellness/sobriety plan.  Patient Goals: "find outpatient treatment and work with the meds I've been prescribed"   Discharge Plan or Barriers: Patient reports plans to return to her hotel room to get her belongings and then move into her new home. CSW will assist with scheduling needs.   Reason for Continuation of Hospitalization: Anxiety Depression Medication stabilization Suicidal ideation  Estimated Length of Stay:  1-7 days  Recreational Therapy: Patient Stressors: N/A Patient Goal: Patient will engage in groups without prompting or encouragement from LRT x3 group sessions within 5 recreation therapy group sessions.   Attendees: Patient: Robin Alvarez 03/31/2020 9:53 AM  Physician: Dr. Neale Burly, MD 03/31/2020 9:53 AM  Nursing: Cecille Amsterdam, RN 03/31/2020 9:53 AM  RN Care Manager: 03/31/2020 9:53 AM  Social Worker: Penni Homans, LCSW 03/31/2020 9:53 AM  Recreational Therapist: Garret Reddish, Drue Flirt, LRT 03/31/2020 9:53 AM  Other: Jillyn Hidden, LCSW 03/31/2020 9:53 AM  Other:  03/31/2020 9:53 AM  Other: 03/31/2020 9:53 AM    Scribe for Treatment Team: Harden Mo, LCSW 03/31/2020 9:53 AM
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
## 2020-03-31 NOTE — Discharge Summary (Signed)
Physician Discharge Summary Note  Patient:  Robin Alvarez is an 39 y.o., female MRN:  [id number] DOB:  09-25-1980 Patient phone:  [phone] (home)  Patient address:   [address] [place] [address],  Total Time spent with patient: 30 minutes  Date of Admission:  03/29/2020 Date of Discharge: 03/31/2020  Reason for Admission:  Suicide attempt via insulin overdose  Principal Problem: Severe recurrent major depression without psychotic features [facility]) Discharge Diagnoses: Principal Problem:   Severe recurrent major depression without psychotic features (HCC) Active Problems:   Alcohol dependence (HCC)   Type I diabetes mellitus with complication, uncontrolled (HCC)   Past Psychiatric History:  H/o depressive disorder 10 years ago. Past psych med - Cymbalta; was effective, no adverse reactions.  Patient has no previous inpatient hospitalizations.Also has had outpatient substance abuse treatment. Denies h/o prior suicidal attempts prior to this admission.   Past Medical History:  Past Medical History:  Diagnosis Date  . Hyperlipidemia   . Type I (juvenile type) diabetes mellitus with hyperosmolarity, not stated as uncontrolled    History reviewed. No pertinent surgical history. Family History:  Family History  Problem Relation Age of Onset  . Alcohol abuse Father   . Alcohol abuse Sister    Family Psychiatric  History: Fathe and sister with alcohol use disorder Social History:  Social History   Substance and Sexual Activity  Alcohol Use Yes  . Alcohol/week: 20.0 standard drinks  . Types: 20 Standard drinks or equivalent per week     Social History   Substance and Sexual Activity  Drug Use Yes  . Frequency: 2.0 times per week  . Types: Marijuana    Social History   Socioeconomic History  . Marital status: Unknown    Spouse name: Not on file  . Number of children: Not on file  . Years of education: Not on file  . Highest education level: Not  on file  Occupational History  . Not on file  Tobacco Use  . Smoking status: Former Smoker    Packs/day: 1.50    Types: Cigarettes    Start date: 08/08/1994    Quit date: 08/08/2010    Years since quitting: 9.6  . Smokeless tobacco: Current User  . Tobacco comment: vapor  Substance and Sexual Activity  . Alcohol use: Yes    Alcohol/week: 20.0 standard drinks    Types: 20 Standard drinks or equivalent per week  . Drug use: Yes    Frequency: 2.0 times per week    Types: Marijuana  . Sexual activity: Not Currently  Other Topics Concern  . Not on file  Social History Narrative  . Not on file   Social Determinants of Health   Financial Resource Strain:   . Difficulty of Paying Living Expenses: Not on file  Food Insecurity:   . Worried About Programme researcher, broadcasting/film/video in the Last Year: Not on file  . Ran Out of Food in the Last Year: Not on file  Transportation Needs:   . Lack of Transportation (Medical): Not on file  . Lack of Transportation (Non-Medical): Not on file  Physical Activity:   . Days of Exercise per Week: Not on file  . Minutes of Exercise per Session: Not on file  Stress:   . Feeling of Stress : Not on file  Social Connections:   . Frequency of Communication with Friends and Family: Not on file  . Frequency of Social Gatherings with Friends and Family: Not on file  .  Attends Religious Services: Not on file  . Active Member of Clubs or Organizations: Not on file  . Attends Club or Organization Meetings: Not on fiBankerle  . Marital Status: Not on file    Hospital Course:  39 year old female with history of MDD and alcohol use disorder who presented to hospital after suicide attempt via insulin overdose. She thinks her recent attempt was an impulsive act while she was intoxicated with alcohol. Her BAL on admission was 254nm/dl. She reports she has been drinking heavily for the last couple weeks, was intoxicated at work and was sent home from her job, became more depressed  feeling hopeless and injected herself with an excessive amount of insulin. Then called a friend of hers who lives out of state but that person called 911. She reports she is glad to be alive now. She is help-seeking now. During this hospital stay she was restarted on Cymbalta which was helpful in the past. She also received hydroxyzine PRN for anxiety, and Trazodone PRN for sleep. Her CIWA on admission was 4, and was zero on discharge. She is very future oriented at this time. She plans to return to hotel to gather her belongings. She will then move in with her friend, [name], for next several weeks. She also plans to retrieve her dog from [place] this Wednesday. She notes that her dog is a big source of social support, and motivator to get out and walk and take care of her own health. She [facility] [place] at discharge. She denied suicidal ideation, homicidal ideations, visual hallucinations, and auditory hallucinations at time of discharge. Patient and treatment team felt she was safe to discharge with close outpatient follow-up.   Physical Findings: AIMS:  , ,  ,  ,    CIWA:  CIWA-Ar Total: 0 COWS:     Musculoskeletal: Strength & Muscle Tone: within normal limits Gait & Station: normal Patient leans: N/A  Psychiatric Specialty Exam: Physical Exam Vitals and nursing note reviewed.  Constitutional:      Appearance: Normal appearance.  HENT:     Head: Normocephalic and atraumatic.     Right Ear: External ear normal.     Left Ear: External ear normal.     Nose: Nose normal.     Mouth/Throat:     Mouth: Mucous membranes are moist.     Pharynx: Oropharynx is clear.  Eyes:     Extraocular Movements: Extraocular movements intact.     Conjunctiva/sclera: Conjunctivae normal.     Pupils: Pupils are equal, round, and reactive to light.  Cardiovascular:     Rate and Rhythm: Normal rate.     Pulses: Normal pulses.  Pulmonary:      Effort: Pulmonary effort is normal.     Breath sounds: Normal breath sounds.  Abdominal:     General: Abdomen is flat.     Palpations: Abdomen is soft.  Musculoskeletal:        General: No swelling. Normal range of motion.     Cervical back: Normal range of motion and neck supple.  Skin:    General: Skin is warm and dry.  Neurological:     General: No focal deficit present.     Mental Status: She is alert and oriented to person, place, and time.  Psychiatric:        Mood and Affect: Mood normal.        Behavior: Behavior normal.  Thought Content: Thought content normal.        Judgment: Judgment normal.     Review of Systems  Constitutional: Negative for activity change and fatigue.  HENT: Negative for rhinorrhea and sore throat.   Eyes: Negative for photophobia and visual disturbance.  Respiratory: Negative for cough and shortness of breath.   Cardiovascular: Negative for chest pain and palpitations.  Gastrointestinal: Negative for constipation, diarrhea, nausea and vomiting.  Endocrine: Negative for cold intolerance and heat intolerance.  Genitourinary: Negative for difficulty urinating and dysuria.  Musculoskeletal: Negative for arthralgias and myalgias.  Skin: Negative for rash and wound.  Allergic/Immunologic: Negative for environmental allergies and food allergies.  Neurological: Negative for dizziness and headaches.  Psychiatric/Behavioral: Negative for hallucinations, sleep disturbance and suicidal ideas.    Blood pressure 116/76, pulse 77, temperature 97.7 F (36.5 C), temperature source Oral, resp. rate 17, height 5\' 4"  (1.626 m), weight 77.1 kg, SpO2 100 %.Body mass index is 29.18 kg/m.  Blood pressure 116/76, pulse 77, temperature 97.7 F (36.5 C), temperature source Oral, resp. rate 17, height 5\' 4"  (1.626 m), weight 77.1 kg, SpO2 100 %.Body mass index is 29.18 kg/m.  General Appearance: Well Groomed  ::  Good  Speech:  Clear and Coherent409   Volume:  Normal  Mood:  Euthymic  Affect:  Appropriate and Congruent  Thought Process:  Coherent and Linear  Orientation:  Full (Time, Place, and Person)  Thought Content:  Logical  Suicidal Thoughts:  No  Homicidal Thoughts:  No  Memory:  Immediate;   Good Recent;   Good Remote;   Good  Judgement:  Fair  Insight:  Fair  Psychomotor Activity:  Normal  Concentration:  Good  Recall:  Good  Fund of Knowledge:Good  Language: Good  Akathisia:  Negative  Handed:  Right  AIMS (if indicated):     Assets:  Communication Skills Desire for Improvement Financial Resources/Insurance Housing Resilience Social Support Talents/Skills Transportation Vocational/Educational  Sleep:  Number of Hours: 8.25  Cognition: WNL  ADL's:  Intact           Has this patient used any form of tobacco in the last 30 days? (Cigarettes, Smokeless Tobacco, Cigars, and/or Pipes) No  Blood Alcohol level:  Lab Results  Component Value Date   ETH 254 (H) 03/28/2020    Metabolic Disorder Labs:  Lab Results  Component Value Date   HGBA1C 7.4 (H) 03/28/2020   MPG 165.68 03/28/2020   No results found for: PROLACTIN No results found for: CHOL, TRIG, HDL, CHOLHDL, VLDL, LDLCALC  See Psychiatric Specialty Exam and Suicide Risk Assessment completed by Attending Physician prior to discharge.  Discharge destination:  Home  Is patient on multiple antipsychotic therapies at discharge:  No   Has Patient had three or more failed trials of antipsychotic monotherapy by history:  No  Recommended Plan for Multiple Antipsychotic Therapies: NA  Discharge Instructions    Diet Carb Modified   Complete by: As directed    Increase activity slowly   Complete by: As directed      Allergies as of 03/31/2020   No Known Allergies     Medication List    TAKE these medications     Indication  DULoxetine 30 MG capsule Commonly known as: CYMBALTA Take 1 capsule (30 mg total) by mouth daily. Start taking  on: April 01, 2020  Indication: Major Depressive Disorder   hydrOXYzine 50 MG tablet Commonly known as: ATARAX/VISTARIL Take 1 tablet (50 mg total) by mouth  3 (three) times daily as needed for anxiety.  Indication: Feeling Anxious   insulin aspart 100 UNIT/ML injection Commonly known as: novoLOG Inject 0-110 Units into the skin as directed. Use up to 110 units via insulin pump  Indication: Insulin-Dependent Diabetes   Pirmella 1/35 tablet Generic drug: norethindrone-ethinyl estradiol 1/35 Take 1 tablet by mouth daily.  Indication: Symptomatic Variation from Normal Periods   traZODone 100 MG tablet Commonly known as: DESYREL Take 1 tablet (100 mg total) by mouth at bedtime as needed for sleep.  Indication: Trouble Sleeping        Follow-up recommendations:  Activity:  as tolerated Diet:  carb-modified, diabetic diet  Comments:  30 day prescription with one refill sent to [facility] on [address] per patient request.   Signed: Jesse Sans, MD 03/31/2020, 10:00 AM

## 2020-03-31 NOTE — Progress Notes (Signed)
Patient ID: Robin Alvarez, female   DOB: [birth year], 39 y.o.   MRN: [id number]  Discharge Note:  Patient denies SI/HI/AVH at this time. Discharge instructions, AVS, prescriptions, and transition record gone over with patient. Patient agrees to comply with medication management, follow-up visit, and outpatient therapy. Patient belongings returned to patient. Patient questions and concerns addressed and answered. Patient ambulatory off unit. Patient discharged back to the [facility] she was staying at, via [employer].

## 2020-03-31 NOTE — Progress Notes (Signed)
Patient is pleasant and easy to engage.  She denies SI  HI  AVH depression and pain at this encounter.  She does endorse anxiety related to current situation regarding housing and relationships. She is med compliant and tolerated her medication without incident.  Her CIWA score this evening was 1 and no medication requested to help with symptoms.  She remains safe on the unit with 15 minute safety checks and was encouraged to contact staff with any concerns.   Cleo Butler-Nicholson, LPN

## 2020-03-31 NOTE — Progress Notes (Signed)
  [facility] Adult Case Management Discharge Plan :  Will you be returning to the same living situation after discharge:  Yes,  pt reports that she is returning to her hotel to get her items then move into her new home.  At discharge, do you have transportation home?: Yes,  CSW will assist with transportation needs. Do you have the ability to pay for your medications: Yes,  [facility]  Release of information consent forms completed and in the chart;  Patient's signature needed at discharge.  Patient to Follow up at:  Follow-up Information    [facility] Follow up on 04/14/2020.   Specialty: Behavioral Health Why: Appointment is scheduled for 04/14/2020 at 11AM. Appointment is VIRTUAL.  Dr. Clarisa Kindred will email you the link for the appointment. Thanks! Contact information: [address] [place] [address] [phone]              Next level of care provider has access to [facility] Link:yes  Safety Planning and Suicide Prevention discussed: No.  Patient declined SPE contact with collaterals.  SPE completed with the patient.      Has patient been referred to the Quitline?: Patient refused referral  Patient has been referred for addiction treatment: Pt. refused referral  Harden Mo, LCSW 03/31/2020, 11:38 AM

## 2020-03-31 NOTE — Progress Notes (Signed)
Inpatient Diabetes Program Recommendations  AACE/ADA: New Consensus Statement on Inpatient Glycemic Control (2015)  Target Ranges:  Prepandial:   less than 140 mg/dL      Peak postprandial:   less than 180 mg/dL (1-2 hours)      Critically ill patients:  140 - 180 mg/dL   Lab Results  Component Value Date   GLUCAP 266 (H) 03/31/2020   HGBA1C 7.4 (H) 03/28/2020    Review of Glycemic Control Results for Robin Alvarez, Robin Alvarez (MRN [id number]) as of 03/31/2020 08:47  Ref. Range 03/30/2020 04:32 03/30/2020 07:43 03/30/2020 11:41 03/30/2020 15:33 03/30/2020 20:18 03/31/2020 00:44 03/31/2020 04:45 03/31/2020 07:38  Glucose-Capillary Latest Ref Range: 70 - 99 mg/dL 353 (H) 614 (H) 431 (H) 388 (H) 154 (H) 280 (H) 135 (H) 266 (H)   Diabetes history: DM 1 Outpatient Diabetes medications:Minimed Medtronic 670 G insulin pump Current orders for Inpatient glycemic control:Novolog 0-24 units q 4 hrs  Inpatient Diabetes Program Recommendations:   Note from endocrinologist Dr. Tedd Sias on 12-19-19. "39 y.o. returns for follow-up for type 1 diabetes. She has a MiniMed Medtronic 670G pump with continuous glucose monitor (CGM). Her pump and sensor were downloaded and reviewed.  Basal rates 12 AM 1.15 unit/hour 6 AM 1.05 unit/hour 9 AM 1.0 unit/hr 24-hour total basal insulin 25.05 units  Bolus settings I:C ratio at 12 AM is 12, at 4 PM is 11 Sensitivity at 12 AM is 57 BG target at 12 AM is 115-125, target at 6 AM is 95-105 Insulin on board duration = 4 hrs"  Inpatient Diabetes Program Recommendations:   While insulin pump off: -Lantus 20 units daily (80% home insulin pump dose) -Novolog 5 units tid meal coverage if eats 50% -Decrease Novolog correction to sensitive tid + hs 0-5 units Change diet to carb modified Secure chat sent to Dr. Toni Amend.  Thanks,  [name], RN, BC-ADM Inpatient Diabetes Coordinator Pager [phone] (8a-5p)

## 2020-03-31 NOTE — Plan of Care (Signed)
  Problem: Education: Goal: Knowledge of [place] [facility] Education information/materials will improve Outcome: Progressing Goal: Emotional status will improve Outcome: Progressing Goal: Mental status will improve Outcome: Progressing Goal: Verbalization of understanding the information provided will improve Outcome: Progressing   Problem: Activity: Goal: Interest or engagement in activities will improve Outcome: Progressing Goal: Sleeping patterns will improve Outcome: Progressing   Problem: Coping: Goal: Ability to verbalize frustrations and anger appropriately will improve Outcome: Progressing Goal: Ability to demonstrate self-control will improve Outcome: Progressing   Problem: Health Behavior/Discharge Planning: Goal: Identification of resources available to assist in meeting health care needs will improve Outcome: Progressing Goal: Compliance with treatment plan for underlying cause of condition will improve Outcome: Progressing   Problem: Physical Regulation: Goal: Ability to maintain clinical measurements within normal limits will improve Outcome: Progressing   Problem: Safety: Goal: Periods of time without injury will increase Outcome: Progressing   Problem: Education: Goal: Utilization of techniques to improve thought processes will improve Outcome: Progressing Goal: Knowledge of the prescribed therapeutic regimen will improve Outcome: Progressing   Problem: Activity: Goal: Interest or engagement in leisure activities will improve Outcome: Progressing Goal: Imbalance in normal sleep/wake cycle will improve Outcome: Progressing   Problem: Coping: Goal: Coping ability will improve Outcome: Progressing Goal: Will verbalize feelings Outcome: Progressing   Problem: Health Behavior/Discharge Planning: Goal: Ability to make decisions will improve Outcome: Progressing Goal: Compliance with therapeutic regimen will improve Outcome: Progressing    Problem: Role Relationship: Goal: Will demonstrate positive changes in social behaviors and relationships Outcome: Progressing   Problem: Safety: Goal: Ability to disclose and discuss suicidal ideas will improve Outcome: Progressing Goal: Ability to identify and utilize support systems that promote safety will improve Outcome: Progressing   Problem: Self-Concept: Goal: Will verbalize positive feelings about self Outcome: Progressing Goal: Level of anxiety will decrease Outcome: Progressing   Problem: Education: Goal: Ability to make informed decisions regarding treatment will improve Outcome: Progressing   Problem: Coping: Goal: Coping ability will improve Outcome: Progressing   Problem: Health Behavior/Discharge Planning: Goal: Identification of resources available to assist in meeting health care needs will improve Outcome: Progressing   Problem: Medication: Goal: Compliance with prescribed medication regimen will improve Outcome: Progressing   Problem: Self-Concept: Goal: Ability to disclose and discuss suicidal ideas will improve Outcome: Progressing Goal: Will verbalize positive feelings about self Outcome: Progressing   

## 2020-03-31 NOTE — BHH Counselor (Signed)
CSW attempted to schedule the patient for an aftercare appointment, however, had to leave a voicemail.   CSW will attempt again.   Penni Homans, MSW, LCSW 03/31/2020 10:38 AM

## 2020-03-31 NOTE — Progress Notes (Signed)
D- Patient alert and oriented. Patient presents in an anxious, but pleasant mood on assessment statement reporting that she slept fair last night and had complaints to voice to this Clinical research associate. Patient endorsed both depression and anxiety on her self-inventory, stating that "I can't even put it into words", is why she's feeling this way. Patient denies SI, HI, AVH, and pain at this time. Patient's goal for today is to "seek therapy for when leave", in which she will "meet with doctors", in order to achieve her goal.  A- Scheduled medications administered to patient, per MD orders. Support and encouragement provided.  Routine safety checks conducted every 15 minutes.  Patient informed to notify staff with problems or concerns.  R- No adverse drug reactions noted. Patient contracts for safety at this time. Patient compliant with medications and treatment plan. Patient receptive, calm, and cooperative. Patient interacts well with others on the unit.  Patient remains safe at this time.

## 2020-04-14 ENCOUNTER — Telehealth (HOSPITAL_COMMUNITY): Payer: 59 | Admitting: Psychiatry

## 2020-09-11 ENCOUNTER — Observation Stay (HOSPITAL_COMMUNITY)
Admission: EM | Admit: 2020-09-11 | Discharge: 2020-09-12 | Disposition: A | Discharge status: Home / Self Care | Payer: 59 | Attending: Internal Medicine | Admitting: Internal Medicine

## 2020-09-11 ENCOUNTER — Emergency Department (HOSPITAL_COMMUNITY): Payer: 59

## 2020-09-11 ENCOUNTER — Other Ambulatory Visit: Payer: Self-pay

## 2020-09-11 ENCOUNTER — Encounter (HOSPITAL_COMMUNITY): Payer: Self-pay

## 2020-09-11 DIAGNOSIS — E1065 Type 1 diabetes mellitus with hyperglycemia: Secondary | ICD-10-CM | POA: Diagnosis present

## 2020-09-11 DIAGNOSIS — Z87891 Personal history of nicotine dependence: Secondary | ICD-10-CM | POA: Diagnosis not present

## 2020-09-11 DIAGNOSIS — Z20822 Contact with and (suspected) exposure to covid-19: Secondary | ICD-10-CM | POA: Insufficient documentation

## 2020-09-11 DIAGNOSIS — E109 Type 1 diabetes mellitus without complications: Secondary | ICD-10-CM | POA: Diagnosis present

## 2020-09-11 DIAGNOSIS — E1010 Type 1 diabetes mellitus with ketoacidosis without coma: Principal | ICD-10-CM | POA: Insufficient documentation

## 2020-09-11 DIAGNOSIS — R112 Nausea with vomiting, unspecified: Secondary | ICD-10-CM | POA: Diagnosis present

## 2020-09-11 DIAGNOSIS — E1110 Type 2 diabetes mellitus with ketoacidosis without coma: Secondary | ICD-10-CM | POA: Diagnosis present

## 2020-09-11 DIAGNOSIS — IMO0002 Reserved for concepts with insufficient information to code with codable children: Secondary | ICD-10-CM | POA: Diagnosis present

## 2020-09-11 DIAGNOSIS — R1110 Vomiting, unspecified: Secondary | ICD-10-CM

## 2020-09-11 LAB — COMPREHENSIVE METABOLIC PANEL
ALT: 34 U/L (ref 0–44)
AST: 32 U/L (ref 15–41)
Albumin: 4.3 g/dL (ref 3.5–5.0)
Alkaline Phosphatase: 91 U/L (ref 38–126)
Anion gap: 23 — ABNORMAL HIGH (ref 5–15)
BUN: 13 mg/dL (ref 6–20)
CO2: 14 mmol/L — ABNORMAL LOW (ref 22–32)
Calcium: 9.2 mg/dL (ref 8.9–10.3)
Chloride: 95 mmol/L — ABNORMAL LOW (ref 98–111)
Creatinine, Ser: 1.04 mg/dL — ABNORMAL HIGH (ref 0.44–1.00)
GFR, Estimated: >60 mL/min (ref >60)
Glucose, Bld: 363 mg/dL — ABNORMAL HIGH (ref 70–99)
Potassium: 4.8 mmol/L (ref 3.5–5.1)
Sodium: 132 mmol/L — ABNORMAL LOW (ref 135–145)
Total Bilirubin: 1.8 mg/dL — ABNORMAL HIGH (ref 0.3–1.2)
Total Protein: 7.9 g/dL (ref 6.5–8.1)

## 2020-09-11 LAB — BLOOD GAS, VENOUS
Acid-base deficit: 14 mmol/L — ABNORMAL HIGH (ref 0.0–2.0)
Bicarbonate: 11.2 mmol/L — ABNORMAL LOW (ref 20.0–28.0)
O2 Saturation: 94 %
Patient temperature: 98.6
pCO2, Ven: 25.1 mmHg — ABNORMAL LOW (ref 44.0–60.0)
pH, Ven: 7.272 (ref 7.250–7.430)
pO2, Ven: 77.7 mmHg — ABNORMAL HIGH (ref 32.0–45.0)

## 2020-09-11 LAB — URINALYSIS, ROUTINE W REFLEX MICROSCOPIC
Bacteria, UA: NONE SEEN
Bilirubin Urine: NEGATIVE
Glucose, UA: >=500 mg/dL — AB
Ketones, ur: 80 mg/dL — AB
Leukocytes,Ua: NEGATIVE
Nitrite: NEGATIVE
Protein, ur: NEGATIVE mg/dL
Specific Gravity, Urine: 1.022 (ref 1.005–1.030)
pH: 5 (ref 5.0–8.0)

## 2020-09-11 LAB — BETA-HYDROXYBUTYRIC ACID
Beta-Hydroxybutyric Acid: 6.84 mmol/L — ABNORMAL HIGH (ref 0.05–0.27)
Beta-Hydroxybutyric Acid: 4.85 mmol/L — ABNORMAL HIGH (ref 0.05–0.27)

## 2020-09-11 LAB — CBC
HCT: 46.6 % — ABNORMAL HIGH (ref 36.0–46.0)
Hemoglobin: 15.1 g/dL — ABNORMAL HIGH (ref 12.0–15.0)
MCH: 31.9 pg (ref 26.0–34.0)
MCHC: 32.4 g/dL (ref 30.0–36.0)
MCV: 98.5 fL (ref 80.0–100.0)
Platelets: 301 10*3/uL (ref 150–400)
RBC: 4.73 MIL/uL (ref 3.87–5.11)
RDW: 12.6 % (ref 11.5–15.5)
WBC: 12.4 10*3/uL — ABNORMAL HIGH (ref 4.0–10.5)
nRBC: 0 % (ref 0.0–0.2)

## 2020-09-11 LAB — BASIC METABOLIC PANEL
Anion gap: 19 — ABNORMAL HIGH (ref 5–15)
Anion gap: 11 (ref 5–15)
BUN: 11 mg/dL (ref 6–20)
BUN: 8 mg/dL (ref 6–20)
CO2: 13 mmol/L — ABNORMAL LOW (ref 22–32)
CO2: 19 mmol/L — ABNORMAL LOW (ref 22–32)
Calcium: 8.7 mg/dL — ABNORMAL LOW (ref 8.9–10.3)
Calcium: 8.7 mg/dL — ABNORMAL LOW (ref 8.9–10.3)
Chloride: 101 mmol/L (ref 98–111)
Chloride: 105 mmol/L (ref 98–111)
Creatinine, Ser: 0.88 mg/dL (ref 0.44–1.00)
Creatinine, Ser: 0.7 mg/dL (ref 0.44–1.00)
GFR, Estimated: >60 mL/min (ref >60)
GFR, Estimated: >60 mL/min (ref >60)
Glucose, Bld: 238 mg/dL — ABNORMAL HIGH (ref 70–99)
Glucose, Bld: 159 mg/dL — ABNORMAL HIGH (ref 70–99)
Potassium: 3.9 mmol/L (ref 3.5–5.1)
Potassium: 3.6 mmol/L (ref 3.5–5.1)
Sodium: 133 mmol/L — ABNORMAL LOW (ref 135–145)
Sodium: 135 mmol/L (ref 135–145)

## 2020-09-11 LAB — CBG MONITORING, ED
Glucose-Capillary: 332 mg/dL — ABNORMAL HIGH (ref 70–99)
Glucose-Capillary: 254 mg/dL — ABNORMAL HIGH (ref 70–99)

## 2020-09-11 LAB — I-STAT BETA HCG BLOOD, ED (MC, WL, AP ONLY): I-stat hCG, quantitative: <5 m[IU]/mL (ref <5)

## 2020-09-11 LAB — HEMOGLOBIN A1C
Hgb A1c MFr Bld: 8.7 % — ABNORMAL HIGH (ref 4.8–5.6)
Mean Plasma Glucose: 202.99 mg/dL

## 2020-09-11 LAB — GLUCOSE, CAPILLARY
Glucose-Capillary: 147 mg/dL — ABNORMAL HIGH (ref 70–99)
Glucose-Capillary: 153 mg/dL — ABNORMAL HIGH (ref 70–99)
Glucose-Capillary: 157 mg/dL — ABNORMAL HIGH (ref 70–99)
Glucose-Capillary: 157 mg/dL — ABNORMAL HIGH (ref 70–99)
Glucose-Capillary: 159 mg/dL — ABNORMAL HIGH (ref 70–99)

## 2020-09-11 LAB — RESP PANEL BY RT-PCR (FLU A&B, COVID) ARPGX2
Influenza A by PCR: NEGATIVE
Influenza B by PCR: NEGATIVE
SARS Coronavirus 2 by RT PCR: NEGATIVE

## 2020-09-11 LAB — HIV ANTIBODY (ROUTINE TESTING W REFLEX): HIV Screen 4th Generation wRfx: NONREACTIVE

## 2020-09-11 LAB — LIPASE, BLOOD: Lipase: 22 U/L (ref 11–51)

## 2020-09-11 MED ORDER — DEXTROSE IN LACTATED RINGERS 5 % IV SOLN: INTRAVENOUS | Status: DC

## 2020-09-11 MED ORDER — ONDANSETRON HCL 4 MG/2ML IJ SOLN
4.0000 mg | Freq: Once | INTRAMUSCULAR | Status: AC
  Administered 2020-09-11: 4 mg via INTRAVENOUS
  Filled 2020-09-11: qty 2

## 2020-09-11 MED ORDER — NORETHINDRONE-ETH ESTRADIOL 1-35 MG-MCG PO TABS: 1.0000 | ORAL_TABLET | Freq: Every day | ORAL | Status: DC

## 2020-09-11 MED ORDER — LIDOCAINE VISCOUS HCL 2 % MT SOLN
15.0000 mL | Freq: Once | OROMUCOSAL | Status: AC
  Administered 2020-09-11: 15 mL via ORAL
  Filled 2020-09-11: qty 15

## 2020-09-11 MED ORDER — CHLORHEXIDINE GLUCONATE 0.12 % MT SOLN: 15.0000 mL | Freq: Two times a day (BID) | OROMUCOSAL | Status: DC

## 2020-09-11 MED ORDER — ALUM & MAG HYDROXIDE-SIMETH 200-200-20 MG/5ML PO SUSP
30.0000 mL | Freq: Once | ORAL | Status: AC
  Administered 2020-09-11: 30 mL via ORAL
  Filled 2020-09-11: qty 30

## 2020-09-11 MED ORDER — SODIUM CHLORIDE 0.9 % IV BOLUS
1000.0000 mL | Freq: Once | INTRAVENOUS | Status: AC
  Administered 2020-09-11: 1000 mL via INTRAVENOUS

## 2020-09-11 MED ORDER — CHLORHEXIDINE GLUCONATE CLOTH 2 % EX PADS
6.0000 | MEDICATED_PAD | Freq: Every day | CUTANEOUS | Status: DC
  Administered 2020-09-11: 6 via TOPICAL

## 2020-09-11 MED ORDER — LACTATED RINGERS IV BOLUS
20.0000 mL/kg | Freq: Once | INTRAVENOUS | Status: AC
  Administered 2020-09-11: 1452 mL via INTRAVENOUS

## 2020-09-11 MED ORDER — ORAL CARE MOUTH RINSE: 15.0000 mL | Freq: Two times a day (BID) | OROMUCOSAL | Status: DC

## 2020-09-11 MED ORDER — ENOXAPARIN SODIUM 40 MG/0.4ML IJ SOSY
40.0000 mg | PREFILLED_SYRINGE | INTRAMUSCULAR | Status: DC
  Administered 2020-09-11: 40 mg via SUBCUTANEOUS
  Filled 2020-09-11: qty 0.4

## 2020-09-11 MED ORDER — POLYVINYL ALCOHOL 1.4 % OP SOLN: 1.0000 [drp] | OPHTHALMIC | Status: DC | PRN

## 2020-09-11 MED ORDER — DEXTROSE 50 % IV SOLN: 0.0000 mL | INTRAVENOUS | Status: DC | PRN

## 2020-09-11 MED ORDER — SUCRALFATE 1 G PO TABS
1.0000 g | ORAL_TABLET | Freq: Once | ORAL | Status: AC
  Administered 2020-09-11: 1 g via ORAL
  Filled 2020-09-11: qty 1

## 2020-09-11 MED ORDER — PROCHLORPERAZINE EDISYLATE 10 MG/2ML IJ SOLN: 10.0000 mg | Freq: Four times a day (QID) | INTRAMUSCULAR | Status: DC | PRN

## 2020-09-11 MED ORDER — INSULIN REGULAR(HUMAN) IN NACL 100-0.9 UT/100ML-% IV SOLN
INTRAVENOUS | Status: DC
  Administered 2020-09-11: 6 [IU]/h via INTRAVENOUS
  Filled 2020-09-11: qty 100

## 2020-09-11 MED ORDER — POTASSIUM CHLORIDE 10 MEQ/100ML IV SOLN
10.0000 meq | INTRAVENOUS | Status: AC
  Administered 2020-09-11 (×2): 10 meq via INTRAVENOUS
  Filled 2020-09-11 (×3): qty 100

## 2020-09-11 MED ORDER — LACTATED RINGERS IV SOLN: INTRAVENOUS | Status: DC

## 2020-09-11 NOTE — H&P (Signed)
History and Physical    ANALYSIA DUNGEE YPP:[id number] DOB: [birth year] DOA: 09/11/2020  PCP: Neldon Labella, PA  Patient coming from: Home  Chief Complaint: N/V  HPI: Robin Alvarez is a 40 y.o. female with medical history significant of DM1, anxiety. Presenting with N/V. Symptoms started yesterday morning around 7 and continued through this morning. She had non-bilious and non-bloody vomiting. No abdominal pain. No changes in diet or medication. No sick contacts. This morning she felt too weak to go to work. So, she came to the ED for help. She admits to not regularly checking her sugar and allowing her insulin pump to just run on its own. She denies any other aggravating or alleviating factors.   ED Course: She was found to have an elevated glucose and anion gap. Her pH was low. She was deemed to be in DKA. She was started on Endotool. TRH was called for admission.   Review of Systems:  Review of systems is otherwise negative for all not mentioned in HPI.   PMHx Past Medical History:  Diagnosis Date  . Hyperlipidemia   . Type I (juvenile type) diabetes mellitus with hyperosmolarity, not stated as uncontrolled     PSHx History reviewed. No pertinent surgical history.  SocHx  reports that she quit smoking about 10 years ago. Her smoking use included cigarettes. She started smoking about 26 years ago. She smoked 1.50 packs per day. She uses smokeless tobacco. She reports current alcohol use of about 20.0 standard drinks of alcohol per week. She reports current drug use. Frequency: 2.00 times per week. Drug: Marijuana.  No Known Allergies  FamHx Family History  Problem Relation Age of Onset  . Alcohol abuse Father   . Alcohol abuse Sister     Prior to Admission medications   Medication Sig Start Date End Date Taking? Authorizing Provider  insulin aspart (NOVOLOG) 100 UNIT/ML injection Inject 0-110 Units into the skin as directed. Use up to 110 units via insulin pump  03/12/20  Yes [provider]  norethindrone-ethinyl estradiol 1/35 (ORTHO-NOVUM) tablet Take 1 tablet by mouth daily. 09/19/17  Yes [provider]  polyvinyl alcohol (LIQUIFILM TEARS) 1.4 % ophthalmic solution Place 1 drop into both eyes as needed for dry eyes.   Yes [provider]  DULoxetine (CYMBALTA) 30 MG capsule Take 1 capsule (30 mg total) by mouth daily. Patient not taking: Reported on 09/11/2020 04/01/20   Jesse Sans, MD  hydrOXYzine (ATARAX/VISTARIL) 50 MG tablet Take 1 tablet (50 mg total) by mouth 3 (three) times daily as needed for anxiety. Patient not taking: Reported on 09/11/2020 03/31/20   Jesse Sans, MD  traZODone (DESYREL) 100 MG tablet Take 1 tablet (100 mg total) by mouth at bedtime as needed for sleep. Patient not taking: Reported on 09/11/2020 03/31/20   Jesse Sans, MD    Physical Exam: Vitals:   09/11/20 1300 09/11/20 1330 09/11/20 1400 09/11/20 1430  BP: 131/76 127/74 135/88 125/75  Pulse: 87 81 95 83  Resp: 18 18 18 18   Temp:      TempSrc:      SpO2: 100% 100% 100% 100%  Weight:      Height:        General: 40 y.o. female resting in bed in NAD Eyes: PERRL, normal sclera ENMT: Nares patent w/o discharge, orophaynx clear, dentition normal, ears w/o discharge/lesions/ulcers Neck: Supple, trachea midline Cardiovascular: RRR, +S1, S2, no m/g/r, equal pulses throughout Respiratory: CTABL, no w/r/r, normal WOB GI:  BS+, NDNT, no masses noted, no organomegaly noted MSK: No e/c/c Skin: No rashes, bruises, ulcerations noted Neuro: A&O x 3, no focal deficits Psyc: Appropriate interaction and affect, calm/cooperative  Labs on Admission: I have personally reviewed following labs and imaging studies  CBC: Recent Labs  Lab 09/11/20 1314  WBC 12.4*  HGB 15.1*  HCT 46.6*  MCV 98.5  PLT 301   Basic Metabolic Panel: Recent Labs  Lab 09/11/20 1314  NA 132*  K 4.8  CL 95*  CO2 14*  GLUCOSE 363*  BUN 13   CREATININE 1.04*  CALCIUM 9.2   GFR: Estimated Creatinine Clearance: 66.4 mL/min (A) (by C-G formula based on SCr of 1.04 mg/dL (H)). Liver Function Tests: Recent Labs  Lab 09/11/20 1314  AST 32  ALT 34  ALKPHOS 91  BILITOT 1.8*  PROT 7.9  ALBUMIN 4.3   Recent Labs  Lab 09/11/20 1314  LIPASE 22   No results for input(s): AMMONIA in the last 168 hours. Coagulation Profile: No results for input(s): INR, PROTIME in the last 168 hours. Cardiac Enzymes: No results for input(s): CKTOTAL, CKMB, CKMBINDEX, TROPONINI in the last 168 hours. BNP (last 3 results) No results for input(s): PROBNP in the last 8760 hours. HbA1C: No results for input(s): HGBA1C in the last 72 hours. CBG: Recent Labs  Lab 09/11/20 1259  GLUCAP 332*   Lipid Profile: No results for input(s): CHOL, HDL, LDLCALC, TRIG, CHOLHDL, LDLDIRECT in the last 72 hours. Thyroid Function Tests: No results for input(s): TSH, T4TOTAL, FREET4, T3FREE, THYROIDAB in the last 72 hours. Anemia Panel: No results for input(s): VITAMINB12, FOLATE, FERRITIN, TIBC, IRON, RETICCTPCT in the last 72 hours. Urine analysis: No results found for: COLORURINE, APPEARANCEUR, LABSPEC, PHURINE, GLUCOSEU, HGBUR, BILIRUBINUR, KETONESUR, PROTEINUR, UROBILINOGEN, NITRITE, LEUKOCYTESUR  Radiological Exams on Admission: No results found.  Assessment/Plan DKA DMt1 uncontrolled N/V     - admit to inpt, SDU     - endotool: insulin gtt, fluids, NPO except for non-caloric fluids for now     - etiology: likely non-compliance; has difficulty affording medications     - TOC consult for medication assistance     - PRN anti-emetics  Anxiety     - continue home regimen  Hyponatremia     - corrected sodium is 138; follow  DVT prophylaxis: lovenox  Code Status: FULL  Family Communication: None at bedside  Consults called: None  Status is: Inpatient  Remains inpatient appropriate because:Inpatient level of care appropriate due to  severity of illness   Dispo: The patient is from: Home              Anticipated d/c is to: Home              Patient currently is not medically stable to d/c.   Difficult to place patient No  Teddy Spike DO [facility]  If 7PM-7AM, please contact night-coverage [email or web address]  09/11/2020, 3:32 PM
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
## 2020-09-11 NOTE — ED Triage Notes (Signed)
Pt reports vomiting since yesterday morning. Pt states she has been unable to keep fluids down. Pt has Type 1 diabetes and her CBGs have been in the 200s. Pt has an insulin pump.

## 2020-09-12 DIAGNOSIS — E1065 Type 1 diabetes mellitus with hyperglycemia: Secondary | ICD-10-CM

## 2020-09-12 DIAGNOSIS — E108 Type 1 diabetes mellitus with unspecified complications: Secondary | ICD-10-CM | POA: Diagnosis not present

## 2020-09-12 DIAGNOSIS — E1010 Type 1 diabetes mellitus with ketoacidosis without coma: Secondary | ICD-10-CM | POA: Diagnosis not present

## 2020-09-12 LAB — BASIC METABOLIC PANEL
Anion gap: 10 (ref 5–15)
Anion gap: 13 (ref 5–15)
BUN: 6 mg/dL (ref 6–20)
BUN: 5 mg/dL — ABNORMAL LOW (ref 6–20)
CO2: 21 mmol/L — ABNORMAL LOW (ref 22–32)
CO2: 20 mmol/L — ABNORMAL LOW (ref 22–32)
Calcium: 8.6 mg/dL — ABNORMAL LOW (ref 8.9–10.3)
Calcium: 8.9 mg/dL (ref 8.9–10.3)
Chloride: 105 mmol/L (ref 98–111)
Chloride: 105 mmol/L (ref 98–111)
Creatinine, Ser: 0.6 mg/dL (ref 0.44–1.00)
Creatinine, Ser: 0.59 mg/dL (ref 0.44–1.00)
GFR, Estimated: >60 mL/min (ref >60)
GFR, Estimated: >60 mL/min (ref >60)
Glucose, Bld: 155 mg/dL — ABNORMAL HIGH (ref 70–99)
Glucose, Bld: 138 mg/dL — ABNORMAL HIGH (ref 70–99)
Potassium: 3.2 mmol/L — ABNORMAL LOW (ref 3.5–5.1)
Potassium: 3.3 mmol/L — ABNORMAL LOW (ref 3.5–5.1)
Sodium: 136 mmol/L (ref 135–145)
Sodium: 138 mmol/L (ref 135–145)

## 2020-09-12 LAB — GLUCOSE, CAPILLARY
Glucose-Capillary: 161 mg/dL — ABNORMAL HIGH (ref 70–99)
Glucose-Capillary: 120 mg/dL — ABNORMAL HIGH (ref 70–99)
Glucose-Capillary: 136 mg/dL — ABNORMAL HIGH (ref 70–99)
Glucose-Capillary: 155 mg/dL — ABNORMAL HIGH (ref 70–99)
Glucose-Capillary: 131 mg/dL — ABNORMAL HIGH (ref 70–99)

## 2020-09-12 LAB — BETA-HYDROXYBUTYRIC ACID: Beta-Hydroxybutyric Acid: 1.78 mmol/L — ABNORMAL HIGH (ref 0.05–0.27)

## 2020-09-12 MED ORDER — POTASSIUM CHLORIDE CRYS ER 20 MEQ PO TBCR
40.0000 meq | EXTENDED_RELEASE_TABLET | Freq: Once | ORAL | Status: AC
  Administered 2020-09-12: 40 meq via ORAL
  Filled 2020-09-12: qty 2

## 2020-09-12 MED ORDER — INSULIN ASPART 100 UNIT/ML IJ SOLN
3.0000 [IU] | Freq: Three times a day (TID) | INTRAMUSCULAR | Status: DC
  Administered 2020-09-12: 3 [IU] via SUBCUTANEOUS

## 2020-09-12 MED ORDER — INSULIN PUMP
Freq: Three times a day (TID) | SUBCUTANEOUS | Status: DC
  Filled 2020-09-12: qty 1

## 2020-09-12 MED ORDER — INSULIN ASPART 100 UNIT/ML IJ SOLN
0.0000 [IU] | Freq: Three times a day (TID) | INTRAMUSCULAR | Status: DC
  Administered 2020-09-12: 2 [IU] via SUBCUTANEOUS

## 2020-09-12 MED ORDER — INSULIN ASPART 100 UNIT/ML IJ SOLN: 0.0000 [IU] | Freq: Every day | INTRAMUSCULAR | Status: DC

## 2020-09-12 MED ORDER — INSULIN GLARGINE 100 UNIT/ML ~~LOC~~ SOLN
17.0000 [IU] | SUBCUTANEOUS | Status: DC
  Administered 2020-09-12: 17 [IU] via SUBCUTANEOUS
  Filled 2020-09-12: qty 0.17

## 2020-09-12 NOTE — Progress Notes (Signed)
Pt has received education. Pt consulted with diabetes coordinator and received instructions to follow at home, and to resume insulin pump. Pt verbalized understanding. IVs removed. Belonging returned. Pt wheeled down safely to meet ride.

## 2020-09-12 NOTE — Progress Notes (Signed)
Inpatient Diabetes Program Recommendations  AACE/ADA: New Consensus Statement on Inpatient Glycemic Control (2015)  Target Ranges:  Prepandial:   less than 140 mg/dL      Peak postprandial:   less than 180 mg/dL (1-2 hours)      Critically ill patients:  140 - 180 mg/dL   Lab Results  Component Value Date   GLUCAP 131 (H) 09/12/2020   HGBA1C 8.7 (H) 09/11/2020    Review of Glycemic Control  Diabetes history: DM1 Outpatient Diabetes medications: [employer] Insulin pump Current orders for Inpatient glycemic control: Lantus 17 units QAM, Novolog 0-9 units TID with meals and 0-5 HS + 3 units TID  Inpatient Diabetes Program Recommendations:     Restart pump with new set when pt gets home. Do quality control check with pump company. Leave basal rate turned off until 09/13/20 at 0500. Use pump to bolus for meals and correction.  Spoke with pt about HgbA1C of 8.7% and importance of checking blood sugars more frequently.    Needs to f/u with Endo when discharged regarding her diabetes control and insulin pump usage. Answered her questions and pt states she will check blood sugars at least 3-4x/day.    Thank you. Ailene Ards, RD, LDN, CDE Inpatient Diabetes Coordinator [phone]

## 2020-09-12 NOTE — Discharge Summary (Signed)
Physician Discharge Summary   Robin Alvarez:[id number] DOB: 08/29/1980 DOA: 09/11/2020  PCP: Robin Labella, PA  Admit date: 09/11/2020 Discharge date: 09/12/2020   Admitted From: home Disposition:  home Discharging physician: Robin Chamber, MD  Recommendations for Outpatient Follow-up:  1. Followup with endocrinology   Patient discharged to home in Discharge Condition: stable Risk of unplanned readmission score: Unplanned Admission- Pilot do not use: 11.14  CODE STATUS: Full Diet recommendation:  Diet Orders (From admission, onward)    Start     Ordered   09/12/20 0334  Diet Carb Modified Fluid consistency: Thin; Room service appropriate? Yes  Diet effective now       Question Answer Comment  Diet-HS Snack? Nothing   Calorie Level Medium 1600-2000   Fluid consistency: Thin   Room service appropriate? Yes      09/12/20 0333   09/12/20 0000  Diet Carb Modified        09/12/20 0859          Hospital Course: Robin Alvarez is a 40 year old female with PMH type 1 diabetes, anxiety who presented with nausea/vomiting.  She underwent work-up on admission and was found to be in DKA.  She stated she had difficulty with monitoring glucose levels due to not having her CGM anymore and has been relying on fingersticks at home.  She has an upcoming appointment with her endocrinologist to assist with getting back on her CGM. She was treated with typical DKA protocol notably fluids and insulin drip.  DKA resolved and she was transitioned to subcutaneous insulins and glucose levels remained stable.  She will discharge home and resume her insulin pump and continue following with endocrinology.   The patient's chronic medical conditions were treated accordingly per the patient's home medication regimen except as noted.  On day of discharge, patient was felt deemed stable for discharge. Patient/family member advised to call PCP or come back to ER if needed.   Principal Diagnosis: DKA  (diabetic ketoacidosis) (HCC)  Discharge Diagnoses: Active Hospital Problems   Diagnosis Date Noted  . Type I diabetes mellitus with complication, uncontrolled (HCC) 12/03/2013    Resolved Hospital Problems   Diagnosis Date Noted Date Resolved  . DKA (diabetic ketoacidosis) (HCC) 09/11/2020 09/12/2020    Discharge Instructions    Diet Carb Modified   Complete by: As directed    Increase activity slowly   Complete by: As directed      Allergies as of 09/12/2020   No Known Allergies     Medication List    STOP taking these medications   DULoxetine 30 MG capsule Commonly known as: CYMBALTA   hydrOXYzine 50 MG tablet Commonly known as: ATARAX/VISTARIL   traZODone 100 MG tablet Commonly known as: DESYREL     TAKE these medications   insulin aspart 100 UNIT/ML injection Commonly known as: novoLOG Inject 0-110 Units into the skin as directed. Use up to 110 units via insulin pump   norethindrone-ethinyl estradiol 1/35 tablet Commonly known as: ORTHO-NOVUM Take 1 tablet by mouth daily.   polyvinyl alcohol 1.4 % ophthalmic solution Commonly known as: LIQUIFILM TEARS Place 1 drop into both eyes as needed for dry eyes.       No Known Allergies  Consultations:   Discharge Exam: BP (!) 153/96   Pulse 85   Temp 98.5 F (36.9 C) (Oral)   Resp 14   Ht 5\' 2"  (1.575 m)   Wt 72.6 kg   LMP 09/08/2020   SpO2 100%  BMI 29.26 kg/m  General appearance: alert, cooperative and no distress Head: Normocephalic, without obvious abnormality, atraumatic Eyes: EOMI Lungs: clear to auscultation bilaterally Heart: regular rate and rhythm and S1, S2 normal Abdomen: normal findings: bowel sounds normal and soft, non-tender Extremities: no edema Skin: mobility and turgor normal Neurologic: Grossly normal  The results of significant diagnostics from this hospitalization (including imaging, microbiology, ancillary and laboratory) are listed below for reference.    Microbiology: Recent Results (from the past 240 hour(s))  Resp Panel by RT-PCR (Flu A&B, Covid) Nasopharyngeal Swab     Status: None   Collection Time: 09/11/20  2:57 PM   Specimen: Nasopharyngeal Swab; Nasopharyngeal(NP) swabs in vial transport medium  Result Value Ref Range Status   SARS Coronavirus 2 by RT PCR NEGATIVE NEGATIVE Final    Comment: (NOTE) SARS-CoV-2 target nucleic acids are NOT DETECTED.  The SARS-CoV-2 RNA is generally detectable in upper respiratory specimens during the acute phase of infection. The lowest concentration of SARS-CoV-2 viral copies this assay can detect is 138 copies/mL. A negative result does not preclude SARS-Cov-2 infection and should not be used as the sole basis for treatment or other patient management decisions. A negative result may occur with  improper specimen collection/handling, submission of specimen other than nasopharyngeal swab, presence of viral mutation(s) within the areas targeted by this assay, and inadequate number of viral copies(<138 copies/mL). A negative result must be combined with clinical observations, patient history, and epidemiological information. The expected result is Negative.  Fact Sheet for Patients:  BloggerCourse.com  Fact Sheet for Healthcare Providers:  SeriousBroker.it  This test is no t yet approved or cleared by the [place] FDA and  has been authorized for detection and/or diagnosis of SARS-CoV-2 by FDA under an Emergency Use Authorization (EUA). This EUA will remain  in effect (meaning this test can be used) for the duration of the COVID-19 declaration under Section 564(b)(1) of the Act, 21 U.S.C.section 360bbb-3(b)(1), unless the authorization is terminated  or revoked sooner.       Influenza A by PCR NEGATIVE NEGATIVE Final   Influenza B by PCR NEGATIVE NEGATIVE Final    Comment: (NOTE) The Xpert Xpress SARS-CoV-2/FLU/RSV plus assay is  intended as an aid in the diagnosis of influenza from Nasopharyngeal swab specimens and should not be used as a sole basis for treatment. Nasal washings and aspirates are unacceptable for Xpert Xpress SARS-CoV-2/FLU/RSV testing.  Fact Sheet for Patients: BloggerCourse.com  Fact Sheet for Healthcare Providers: SeriousBroker.it  This test is not yet approved or cleared by the [place] FDA and has been authorized for detection and/or diagnosis of SARS-CoV-2 by FDA under an Emergency Use Authorization (EUA). This EUA will remain in effect (meaning this test can be used) for the duration of the COVID-19 declaration under Section 564(b)(1) of the Act, 21 U.S.C. section 360bbb-3(b)(1), unless the authorization is terminated or revoked.  Performed at [facility], [address], [place], [place] [address]      Labs: BNP (last 3 results) No results for input(s): BNP in the last 8760 hours. Basic Metabolic Panel: Recent Labs  Lab 09/11/20 1314 09/11/20 1456 09/11/20 2028 09/12/20 0111 09/12/20 0525  NA 132* 133* 135 136 138  K 4.8 3.9 3.6 3.2* 3.3*  CL 95* 101 105 105 105  CO2 14* 13* 19* 21* 20*  GLUCOSE 363* 238* 159* 155* 138*  BUN 13 11 8 6  5*  CREATININE 1.04* 0.88 0.70 0.60 0.59  CALCIUM 9.2 8.7* 8.7*  8.6* 8.9   Liver Function Tests: Recent Labs  Lab 09/11/20 1314  AST 32  ALT 34  ALKPHOS 91  BILITOT 1.8*  PROT 7.9  ALBUMIN 4.3   Recent Labs  Lab 09/11/20 1314  LIPASE 22   No results for input(s): AMMONIA in the last 168 hours. CBC: Recent Labs  Lab 09/11/20 1314  WBC 12.4*  HGB 15.1*  HCT 46.6*  MCV 98.5  PLT 301   Cardiac Enzymes: No results for input(s): CKTOTAL, CKMB, CKMBINDEX, TROPONINI in the last 168 hours. BNP: Invalid input(s): POCBNP CBG: Recent Labs  Lab 09/12/20 0144 09/12/20 0357 09/12/20 0504 09/12/20 0705 09/12/20 0810  GLUCAP 161* 120* 136* 155*  131*   D-Dimer No results for input(s): DDIMER in the last 72 hours. Hgb A1c Recent Labs    09/11/20 1832  HGBA1C 8.7*   Lipid Profile No results for input(s): CHOL, HDL, LDLCALC, TRIG, CHOLHDL, LDLDIRECT in the last 72 hours. Thyroid function studies No results for input(s): TSH, T4TOTAL, T3FREE, THYROIDAB in the last 72 hours.  Invalid input(s): FREET3 Anemia work up No results for input(s): VITAMINB12, FOLATE, FERRITIN, TIBC, IRON, RETICCTPCT in the last 72 hours. Urinalysis    Component Value Date/Time   COLORURINE YELLOW 09/11/2020 1314   APPEARANCEUR CLEAR 09/11/2020 1314   LABSPEC 1.022 09/11/2020 1314   PHURINE 5.0 09/11/2020 1314   GLUCOSEU >=500 (A) 09/11/2020 1314   HGBUR MODERATE (A) 09/11/2020 1314   BILIRUBINUR NEGATIVE 09/11/2020 1314   KETONESUR 80 (A) 09/11/2020 1314   PROTEINUR NEGATIVE 09/11/2020 1314   NITRITE NEGATIVE 09/11/2020 1314   LEUKOCYTESUR NEGATIVE 09/11/2020 1314   Sepsis Labs Invalid input(s): PROCALCITONIN,  WBC,  LACTICIDVEN Microbiology Recent Results (from the past 240 hour(s))  Resp Panel by RT-PCR (Flu A&B, Covid) Nasopharyngeal Swab     Status: None   Collection Time: 09/11/20  2:57 PM   Specimen: Nasopharyngeal Swab; Nasopharyngeal(NP) swabs in vial transport medium  Result Value Ref Range Status   SARS Coronavirus 2 by RT PCR NEGATIVE NEGATIVE Final    Comment: (NOTE) SARS-CoV-2 target nucleic acids are NOT DETECTED.  The SARS-CoV-2 RNA is generally detectable in upper respiratory specimens during the acute phase of infection. The lowest concentration of SARS-CoV-2 viral copies this assay can detect is 138 copies/mL. A negative result does not preclude SARS-Cov-2 infection and should not be used as the sole basis for treatment or other patient management decisions. A negative result may occur with  improper specimen collection/handling, submission of specimen other than nasopharyngeal swab, presence of viral mutation(s)  within the areas targeted by this assay, and inadequate number of viral copies(<138 copies/mL). A negative result must be combined with clinical observations, patient history, and epidemiological information. The expected result is Negative.  Fact Sheet for Patients:  BloggerCourse.com  Fact Sheet for Healthcare Providers:  SeriousBroker.it  This test is no t yet approved or cleared by the [place] FDA and  has been authorized for detection and/or diagnosis of SARS-CoV-2 by FDA under an Emergency Use Authorization (EUA). This EUA will remain  in effect (meaning this test can be used) for the duration of the COVID-19 declaration under Section 564(b)(1) of the Act, 21 U.S.C.section 360bbb-3(b)(1), unless the authorization is terminated  or revoked sooner.       Influenza A by PCR NEGATIVE NEGATIVE Final   Influenza B by PCR NEGATIVE NEGATIVE Final    Comment: (NOTE) The Xpert Xpress SARS-CoV-2/FLU/RSV plus assay is intended as an aid in the diagnosis of  influenza from Nasopharyngeal swab specimens and should not be used as a sole basis for treatment. Nasal washings and aspirates are unacceptable for Xpert Xpress SARS-CoV-2/FLU/RSV testing.  Fact Sheet for Patients: BloggerCourse.comhttps://www.fda.gov/media/152166/download  Fact Sheet for Healthcare Providers: SeriousBroker.ithttps://www.fda.gov/media/152162/download  This test is not yet approved or cleared by the [place] FDA and has been authorized for detection and/or diagnosis of SARS-CoV-2 by FDA under an Emergency Use Authorization (EUA). This EUA will remain in effect (meaning this test can be used) for the duration of the COVID-19 declaration under Section 564(b)(1) of the Act, 21 U.S.C. section 360bbb-3(b)(1), unless the authorization is terminated or revoked.  Performed at [facility], [address] [place], [place] [address]     Procedures/Studies: DG Chest  Port 1 View  Result Date: 09/11/2020 CLINICAL DATA:  DKA.  Vomiting. EXAM: PORTABLE CHEST 1 VIEW COMPARISON:  Chest x-ray 04/16/2017. FINDINGS: Mediastinum and hilar structures normal. Heart size normal. No focal infiltrate. No pleural effusion or pneumothorax. No acute bony abnormality. IMPRESSION: No acute cardiopulmonary disease. Electronically Signed   By: Maisie Fushomas  Register   On: 09/11/2020 15:41     Time coordinating discharge: Over 30 minutes    Robin Chamberavid Kinnedy Mongiello, MD  [facility] 09/12/2020, 2:54 PM

## 2020-09-12 NOTE — Hospital Course (Signed)
Robin Alvarez is a 40 year old female with PMH type 1 diabetes, anxiety who presented with nausea/vomiting.  She underwent work-up on admission and was found to be in DKA.  She stated she had difficulty with monitoring glucose levels due to not having her CGM anymore and has been relying on fingersticks at home.  She has an upcoming appointment with her endocrinologist to assist with getting back on her CGM. She was treated with typical DKA protocol notably fluids and insulin drip.  DKA resolved and she was transitioned to subcutaneous insulins and glucose levels remained stable.  She will discharge home and resume her insulin pump and continue following with endocrinology.
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
## 2021-03-10 ENCOUNTER — Emergency Department (HOSPITAL_COMMUNITY): Payer: 59

## 2021-03-10 ENCOUNTER — Encounter (HOSPITAL_COMMUNITY): Payer: Self-pay

## 2021-03-10 ENCOUNTER — Inpatient Hospital Stay (HOSPITAL_COMMUNITY)
Admission: EM | Admit: 2021-03-10 | Discharge: 2021-03-14 | DRG: 494 | Disposition: A | Discharge status: Home / Self Care | Payer: 59 | Attending: Internal Medicine | Admitting: Internal Medicine

## 2021-03-10 DIAGNOSIS — S82841A Displaced bimalleolar fracture of right lower leg, initial encounter for closed fracture: Secondary | ICD-10-CM | POA: Diagnosis not present

## 2021-03-10 DIAGNOSIS — F1020 Alcohol dependence, uncomplicated: Secondary | ICD-10-CM | POA: Diagnosis present

## 2021-03-10 DIAGNOSIS — W010XXA Fall on same level from slipping, tripping and stumbling without subsequent striking against object, initial encounter: Secondary | ICD-10-CM | POA: Diagnosis present

## 2021-03-10 DIAGNOSIS — D72829 Elevated white blood cell count, unspecified: Secondary | ICD-10-CM | POA: Diagnosis present

## 2021-03-10 DIAGNOSIS — S82891A Other fracture of right lower leg, initial encounter for closed fracture: Secondary | ICD-10-CM

## 2021-03-10 DIAGNOSIS — S9304XA Dislocation of right ankle joint, initial encounter: Secondary | ICD-10-CM | POA: Diagnosis present

## 2021-03-10 DIAGNOSIS — E109 Type 1 diabetes mellitus without complications: Secondary | ICD-10-CM | POA: Diagnosis present

## 2021-03-10 DIAGNOSIS — Z9641 Presence of insulin pump (external) (internal): Secondary | ICD-10-CM | POA: Diagnosis present

## 2021-03-10 DIAGNOSIS — Z720 Tobacco use: Secondary | ICD-10-CM

## 2021-03-10 DIAGNOSIS — R52 Pain, unspecified: Secondary | ICD-10-CM

## 2021-03-10 DIAGNOSIS — E785 Hyperlipidemia, unspecified: Secondary | ICD-10-CM | POA: Diagnosis present

## 2021-03-10 DIAGNOSIS — Y906 Blood alcohol level of 120-199 mg/100 ml: Secondary | ICD-10-CM | POA: Diagnosis present

## 2021-03-10 DIAGNOSIS — Z794 Long term (current) use of insulin: Secondary | ICD-10-CM

## 2021-03-10 DIAGNOSIS — Z811 Family history of alcohol abuse and dependence: Secondary | ICD-10-CM

## 2021-03-10 DIAGNOSIS — Z20822 Contact with and (suspected) exposure to covid-19: Secondary | ICD-10-CM | POA: Diagnosis present

## 2021-03-10 DIAGNOSIS — Z793 Long term (current) use of hormonal contraceptives: Secondary | ICD-10-CM

## 2021-03-10 MED ORDER — FENTANYL CITRATE PF 50 MCG/ML IJ SOSY
50.0000 ug | PREFILLED_SYRINGE | Freq: Once | INTRAMUSCULAR | Status: AC
  Administered 2021-03-10: 50 ug via INTRAVENOUS
  Filled 2021-03-10: qty 1

## 2021-03-10 MED ORDER — PROPOFOL 10 MG/ML IV BOLUS
0.5000 mg/kg | Freq: Once | INTRAVENOUS | Status: AC
  Administered 2021-03-10: 50 mg via INTRAVENOUS
  Filled 2021-03-10: qty 20

## 2021-03-10 MED ORDER — HYDROMORPHONE HCL 1 MG/ML IJ SOLN
1.0000 mg | Freq: Once | INTRAMUSCULAR | Status: AC
  Administered 2021-03-11: 1 mg via INTRAVENOUS
  Filled 2021-03-10: qty 1

## 2021-03-10 MED ORDER — ONDANSETRON HCL 4 MG/2ML IJ SOLN
INTRAMUSCULAR | Status: AC
  Administered 2021-03-10: 4 mg
  Filled 2021-03-10: qty 2

## 2021-03-10 NOTE — ED Provider Notes (Signed)
[facility] EMERGENCY DEPARTMENT Provider Note  CSN: [id number] Arrival date & time: 03/10/21 2146    History No chief complaint on file.   Robin Alvarez is a 40 y.o. female reports she slipped and fell on some leaves just prior to arrival injuring her R ankle. Noted to have a deformity to her ankle, given fentanyl and ketamine by EMS. She is complaining of 10/10 pain, worse with movement. Denies any other injuries. Admits to EtOH use tonight.    Past Medical History:  Diagnosis Date  . Hyperlipidemia   . Type I (juvenile type) diabetes mellitus with hyperosmolarity, not stated as uncontrolled     History reviewed. No pertinent surgical history.  Family History  Problem Relation Age of Onset  . Alcohol abuse Father   . Alcohol abuse Sister     Social History   Tobacco Use  . Smoking status: Former    Packs/day: 1.50    Types: Cigarettes    Start date: 08/08/1994    Quit date: 08/08/2010    Years since quitting: 10.5  . Smokeless tobacco: Current  . Tobacco comments:    vapor  Substance Use Topics  . Alcohol use: Yes    Alcohol/week: 20.0 standard drinks    Types: 20 Standard drinks or equivalent per week  . Drug use: Yes    Frequency: 2.0 times per week    Types: Marijuana     Home Medications Prior to Admission medications   Medication Sig Start Date End Date Taking? Authorizing Provider  insulin aspart (NOVOLOG) 100 UNIT/ML injection Inject 0-110 Units into the skin as directed. Use up to 110 units via insulin pump 03/12/20   [provider]  norethindrone-ethinyl estradiol 1/35 (ORTHO-NOVUM) tablet Take 1 tablet by mouth daily. 09/19/17   [provider]  polyvinyl alcohol (LIQUIFILM TEARS) 1.4 % ophthalmic solution Place 1 drop into both eyes as needed for dry eyes.    [provider]     Allergies    Patient has no known allergies.   Review of Systems   Review of Systems A comprehensive review of systems was  completed and negative except as noted in HPI.    Physical Exam LMP 03/05/2021   Physical Exam Vitals and nursing note reviewed.  Constitutional:      Appearance: Normal appearance.  HENT:     Head: Normocephalic and atraumatic.     Nose: Nose normal.     Mouth/Throat:     Mouth: Mucous membranes are moist.  Eyes:     Extraocular Movements: Extraocular movements intact.     Conjunctiva/sclera: Conjunctivae normal.  Cardiovascular:     Rate and Rhythm: Normal rate.  Pulmonary:     Effort: Pulmonary effort is normal.     Breath sounds: Normal breath sounds.  Abdominal:     General: Abdomen is flat.     Palpations: Abdomen is soft.     Tenderness: There is no abdominal tenderness.  Musculoskeletal:        General: Deformity (R ankle) present. No swelling.     Cervical back: Neck supple.     Comments: NVI, skin is tented over the medial maleolus  Skin:    General: Skin is warm and dry.  Neurological:     General: No focal deficit present.     Mental Status: She is alert.  Psychiatric:        Mood and Affect: Mood normal.     ED Results / Procedures / Treatments  Labs (all labs ordered are listed, but only abnormal results are displayed) Labs Reviewed - No data to display  EKG None  Radiology DG Ankle 2 Views Right  Result Date: 03/10/2021 CLINICAL DATA:  Status post fall. EXAM: RIGHT ANKLE - 2 VIEW COMPARISON:  None. FINDINGS: An acute, displaced fracture deformity is seen involving the right lateral malleolus. An additional fracture of the right lateral malleolus is suspected. There is posterior dislocation of the right ankle. Diffuse soft tissue swelling is seen. IMPRESSION: Acute fracture of the right lateral malleolus with posterior dislocation of the right ankle. Electronically Signed   By: Aram Candela M.D.   On: 03/10/2021 22:27    Procedures .Ortho Injury Treatment  Date/Time: 03/10/2021 10:44 PM Performed by: Pollyann Savoy, MD Authorized by:  Pollyann Savoy, MD   Consent:    Consent obtained:  Verbal and written   Risks discussed:  Recurrent dislocationInjury location: ankle Location details: right ankle Injury type: fracture-dislocation Fracture type: lateral malleolus Pre-procedure neurovascular assessment: neurovascularly intact Pre-procedure distal perfusion comment: tenting of skin over medial maleolus Pre-procedure range of motion: reduced  Patient sedated: Yes. Refer to sedation procedure documentation for details of sedation. Manipulation performed: yes Skin traction used: yes Reduction successful: yes Immobilization: splint Splint type: short leg and ankle stirrup Splint Applied by: ED Provider Supplies used: Ortho-Glass Post-procedure neurovascular assessment: post-procedure neurovascularly intact Post-procedure distal perfusion: normal Post-procedure neurological function: normal Post-procedure range of motion: unchanged    Medications Ordered in the ED Medications  propofol (DIPRIVAN) 10 mg/mL bolus/IV push 0.5 mg/kg (has no administration in time range)  fentaNYL (SUBLIMAZE) injection 50 mcg (has no administration in time range)     MDM Rules/Calculators/A&P MDM  Patient arrives with deformity of R ankle with tenting skin. Sedation by Dr. Charm Barges and reduction as above. Will check repeat xray.   ED Course  I have reviewed the triage vital signs and the nursing notes.  Pertinent labs & imaging results that were available during my care of the patient were reviewed by me and considered in my medical decision making (see chart for details).  Clinical Course as of 03/11/21 1456  Tue Mar 10, 2021  2320 Spoke with Dr. Shon Baton, Ortho, who agrees with plan for medical admission and one of the Ortho/Ankle doctors will see her in the AM. He requests a CT for further evaluation of her fracture. Labs ordered for evaluation of her DM, EtOH and for pre-op. Plan Hospitalist admit when labs have resulted. Care  of the patient signed out to Dr. Adela Lank at the change of shift.  [CS]    Clinical Course User Index [CS] Pollyann Savoy, MD    Final Clinical Impression(s) / ED Diagnoses Final diagnoses:  None    Rx / DC Orders ED Discharge Orders     None        Pollyann Savoy, MD 03/11/21 1456

## 2021-03-10 NOTE — Progress Notes (Signed)
This Clinical research associate was called to pt bedside by RN for conscious sedation procedure.  Pt tolerated procedure well without incident.

## 2021-03-10 NOTE — ED Provider Notes (Signed)
  Physical Exam  BP (!) 147/99 (BP Location: Right Arm)   Pulse 75   Temp 97.9 F (36.6 C) (Oral)   Resp 15   Ht 5\' 2"  (1.575 m)   Wt 70.8 kg   LMP 03/05/2021   SpO2 100%   BMI 28.53 kg/m     ED Course/Procedures   Clinical Course as of 03/10/21 2334  Tue Mar 10, 2021  2320 Spoke with Dr. Mar 12, 2021, Ortho, who agrees with plan for medical admission and one of the Ortho/Ankle doctors will see her in the AM. He requests a CT for further evaluation of her fracture. Labs ordered for evaluation of her DM, EtOH and for pre-op. Plan Hospitalist admit when labs have resulted. Care of the patient signed out to Dr. Shon Baton at the change of shift.  [CS]    Clinical Course User Index [CS] Adela Lank, MD    .Ortho Injury Treatment  Date/Time: 03/10/2021 10:47 PM Performed by: 03/12/2021, PA-C Authorized by: Garlon Hatchet, PA-C   Consent:    Consent obtained:  Verbal   Consent given by:  Patient   Risks discussed:  Nerve damage, stiffness and recurrent dislocation   Alternatives discussed:  No treatmentInjury location: ankle Location details: left ankle Injury type: fracture-dislocation Pre-procedure neurovascular assessment: neurovascularly intact  Anesthesia: Local anesthesia used: no  Patient sedated: Yes. Refer to sedation procedure documentation for details of sedation. Manipulation performed: yes Reduction successful: yes X-ray confirmed reduction: yes Immobilization: splint Splint type: ankle stirrup and short leg Splint Applied by: ED Provider Supplies used: aluminum splint, cotton padding and elastic bandage Post-procedure neurovascular assessment: post-procedure neurovascularly intact Comments: Short leg ankle splint with stirrups applied.  No complications.    MDM         Garlon Hatchet, PA-C 03/10/21 03/12/21    0258, MD 03/11/21 [phone]

## 2021-03-10 NOTE — ED Provider Notes (Signed)
Patient seen in consultation with Dr. Bernette Mayers.  I was a second provider involved in the procedural sedation for her right ankle fracture dislocation.  .Sedation  Date/Time: 03/10/2021 10:43 PM Performed by: Terrilee Files, MD Authorized by: Terrilee Files, MD   Consent:    Consent obtained:  Verbal and written   Consent given by:  Patient   Risks discussed:  Allergic reaction, prolonged hypoxia resulting in organ damage, prolonged sedation necessitating reversal, respiratory compromise necessitating ventilatory assistance and intubation, vomiting, nausea, inadequate sedation and dysrhythmia   Alternatives discussed:  Analgesia without sedation Universal protocol:    Procedure explained and questions answered to patient or proxy's satisfaction: yes     Relevant documents present and verified: yes     Imaging studies available: yes     Required blood products, implants, devices, and special equipment available: yes     Immediately prior to procedure, a time out was called: yes     Patient identity confirmed:  Arm band Indications:    Procedure performed:  Fracture reduction   Procedure necessitating sedation performed by:  Physician performing sedation Pre-sedation assessment:    Time since last food or drink:  4   ASA classification: class 2 - patient with mild systemic disease     Mouth opening:  3 or more finger widths   Thyromental distance:  3 finger widths   Mallampati score:  II - soft palate, uvula, fauces visible   Neck mobility: normal     Pre-sedation assessments completed and reviewed: airway patency, cardiovascular function, hydration status, mental status, nausea/vomiting, pain level, respiratory function and temperature     Pre-sedation assessment completed:  03/10/2021 10:25 PM Immediate pre-procedure details:    Reassessment: Patient reassessed immediately prior to procedure     Reviewed: vital signs, relevant labs/tests and NPO status     Verified: bag valve  mask available, emergency equipment available, intubation equipment available, IV patency confirmed, oxygen available and suction available   Procedure details (see MAR for exact dosages):    Preoxygenation:  Nasal cannula   Sedation:  Propofol   Intended level of sedation: deep   Analgesia:  Fentanyl   Intra-procedure monitoring:  Blood pressure monitoring, cardiac monitor, continuous pulse oximetry, continuous capnometry, frequent LOC assessments and frequent vital sign checks   Intra-procedure events: none     Total Provider sedation time (minutes):  10 Post-procedure details:    Post-sedation assessment completed:  03/10/2021 11:30 PM   Attendance: Constant attendance by certified staff until patient recovered     Recovery: Patient returned to pre-procedure baseline     Post-sedation assessments completed and reviewed: airway patency, cardiovascular function, hydration status, mental status, nausea/vomiting, pain level, respiratory function and temperature     Patient is stable for discharge or admission: yes     Procedure completion:  Tolerated well, no immediate complications    Terrilee Files, MD 03/11/21 1106

## 2021-03-10 NOTE — ED Triage Notes (Signed)
Pt slipped on rough terrain and leaves outside her apartment and rolled her ankle, there is obvious deformity and her pain is still 10/10 Pt has received 100 meq of fentanyl and two ketamine drips in route by EMS

## 2021-03-10 NOTE — ED Notes (Signed)
2228: Dr Bernette Mayers, RT, NT and Rn at bedside 2235: Timeout 2235: 4mg  zofran given per verbal order from Dr , Bernette Mayers fentanyl given, 50mg  propofol given per verbal order from Dr 2237: 25mg  propofl given per verbal order from Dr Bernette Mayers 2237: Attempt at reduction 2238: Ankle wrapped by Dr Bernette Mayers PA assisting 2241: Procedure ended

## 2021-03-11 ENCOUNTER — Encounter (HOSPITAL_COMMUNITY): Payer: Self-pay | Admitting: Internal Medicine

## 2021-03-11 DIAGNOSIS — Z793 Long term (current) use of hormonal contraceptives: Secondary | ICD-10-CM | POA: Diagnosis not present

## 2021-03-11 DIAGNOSIS — F10220 Alcohol dependence with intoxication, uncomplicated: Secondary | ICD-10-CM

## 2021-03-11 DIAGNOSIS — Z811 Family history of alcohol abuse and dependence: Secondary | ICD-10-CM | POA: Diagnosis not present

## 2021-03-11 DIAGNOSIS — S82841A Displaced bimalleolar fracture of right lower leg, initial encounter for closed fracture: Secondary | ICD-10-CM | POA: Diagnosis present

## 2021-03-11 DIAGNOSIS — Z9641 Presence of insulin pump (external) (internal): Secondary | ICD-10-CM | POA: Diagnosis present

## 2021-03-11 DIAGNOSIS — S82891A Other fracture of right lower leg, initial encounter for closed fracture: Secondary | ICD-10-CM | POA: Diagnosis present

## 2021-03-11 DIAGNOSIS — Z794 Long term (current) use of insulin: Secondary | ICD-10-CM | POA: Diagnosis not present

## 2021-03-11 DIAGNOSIS — D72829 Elevated white blood cell count, unspecified: Secondary | ICD-10-CM | POA: Diagnosis present

## 2021-03-11 DIAGNOSIS — E109 Type 1 diabetes mellitus without complications: Secondary | ICD-10-CM | POA: Diagnosis present

## 2021-03-11 DIAGNOSIS — Z720 Tobacco use: Secondary | ICD-10-CM | POA: Diagnosis not present

## 2021-03-11 DIAGNOSIS — E785 Hyperlipidemia, unspecified: Secondary | ICD-10-CM | POA: Diagnosis present

## 2021-03-11 DIAGNOSIS — Y906 Blood alcohol level of 120-199 mg/100 ml: Secondary | ICD-10-CM | POA: Diagnosis present

## 2021-03-11 DIAGNOSIS — F1020 Alcohol dependence, uncomplicated: Secondary | ICD-10-CM | POA: Diagnosis present

## 2021-03-11 DIAGNOSIS — S9304XA Dislocation of right ankle joint, initial encounter: Secondary | ICD-10-CM | POA: Diagnosis present

## 2021-03-11 DIAGNOSIS — W010XXA Fall on same level from slipping, tripping and stumbling without subsequent striking against object, initial encounter: Secondary | ICD-10-CM | POA: Diagnosis present

## 2021-03-11 DIAGNOSIS — Z20822 Contact with and (suspected) exposure to covid-19: Secondary | ICD-10-CM | POA: Diagnosis present

## 2021-03-11 LAB — CBC WITH DIFFERENTIAL/PLATELET
Abs Immature Granulocytes: 0.07 10*3/uL (ref 0.00–0.07)
Abs Immature Granulocytes: 0.11 10*3/uL — ABNORMAL HIGH (ref 0.00–0.07)
Basophils Absolute: 0.1 10*3/uL (ref 0.0–0.1)
Basophils Absolute: 0.1 10*3/uL (ref 0.0–0.1)
Basophils Relative: 0 %
Basophils Relative: 0 %
Eosinophils Absolute: 0.1 10*3/uL (ref 0.0–0.5)
Eosinophils Absolute: 0.1 10*3/uL (ref 0.0–0.5)
Eosinophils Relative: 0 %
Eosinophils Relative: 1 %
HCT: 40.2 % (ref 36.0–46.0)
HCT: 41.2 % (ref 36.0–46.0)
Hemoglobin: 13.6 g/dL (ref 12.0–15.0)
Hemoglobin: 13.7 g/dL (ref 12.0–15.0)
Immature Granulocytes: 0 %
Immature Granulocytes: 1 %
Lymphocytes Relative: 15 %
Lymphocytes Relative: 22 %
Lymphs Abs: 2.7 10*3/uL (ref 0.7–4.0)
Lymphs Abs: 3.6 10*3/uL (ref 0.7–4.0)
MCH: 31.9 pg (ref 26.0–34.0)
MCH: 32.7 pg (ref 26.0–34.0)
MCHC: 33.3 g/dL (ref 30.0–36.0)
MCHC: 33.8 g/dL (ref 30.0–36.0)
MCV: 95.8 fL (ref 80.0–100.0)
MCV: 96.6 fL (ref 80.0–100.0)
Monocytes Absolute: 0.6 10*3/uL (ref 0.1–1.0)
Monocytes Absolute: 1.1 10*3/uL — ABNORMAL HIGH (ref 0.1–1.0)
Monocytes Relative: 3 %
Monocytes Relative: 7 %
Neutro Abs: 11.8 10*3/uL — ABNORMAL HIGH (ref 1.7–7.7)
Neutro Abs: 14.8 10*3/uL — ABNORMAL HIGH (ref 1.7–7.7)
Neutrophils Relative %: 70 %
Neutrophils Relative %: 81 %
Platelets: 388 10*3/uL (ref 150–400)
Platelets: 412 10*3/uL — ABNORMAL HIGH (ref 150–400)
RBC: 4.16 MIL/uL (ref 3.87–5.11)
RBC: 4.3 MIL/uL (ref 3.87–5.11)
RDW: 13.2 % (ref 11.5–15.5)
RDW: 13.5 % (ref 11.5–15.5)
WBC: 16.7 10*3/uL — ABNORMAL HIGH (ref 4.0–10.5)
WBC: 18.3 10*3/uL — ABNORMAL HIGH (ref 4.0–10.5)
nRBC: 0 % (ref 0.0–0.2)
nRBC: 0 % (ref 0.0–0.2)

## 2021-03-11 LAB — COMPREHENSIVE METABOLIC PANEL
ALT: 18 U/L (ref 0–44)
ALT: 18 U/L (ref 0–44)
AST: 20 U/L (ref 15–41)
AST: 21 U/L (ref 15–41)
Albumin: 3.9 g/dL (ref 3.5–5.0)
Albumin: 3.9 g/dL (ref 3.5–5.0)
Alkaline Phosphatase: 53 U/L (ref 38–126)
Alkaline Phosphatase: 58 U/L (ref 38–126)
Anion gap: 11 (ref 5–15)
Anion gap: 12 (ref 5–15)
BUN: 7 mg/dL (ref 6–20)
BUN: 8 mg/dL (ref 6–20)
CO2: 20 mmol/L — ABNORMAL LOW (ref 22–32)
CO2: 20 mmol/L — ABNORMAL LOW (ref 22–32)
Calcium: 8.6 mg/dL — ABNORMAL LOW (ref 8.9–10.3)
Calcium: 8.8 mg/dL — ABNORMAL LOW (ref 8.9–10.3)
Chloride: 106 mmol/L (ref 98–111)
Chloride: 110 mmol/L (ref 98–111)
Creatinine, Ser: 0.62 mg/dL (ref 0.44–1.00)
Creatinine, Ser: 0.67 mg/dL (ref 0.44–1.00)
GFR, Estimated: >60 mL/min (ref >60)
GFR, Estimated: >60 mL/min (ref >60)
Glucose, Bld: 115 mg/dL — ABNORMAL HIGH (ref 70–99)
Glucose, Bld: 80 mg/dL (ref 70–99)
Potassium: 3.5 mmol/L (ref 3.5–5.1)
Potassium: 3.6 mmol/L (ref 3.5–5.1)
Sodium: 138 mmol/L (ref 135–145)
Sodium: 141 mmol/L (ref 135–145)
Total Bilirubin: 0.3 mg/dL (ref 0.3–1.2)
Total Bilirubin: 0.3 mg/dL (ref 0.3–1.2)
Total Protein: 7.3 g/dL (ref 6.5–8.1)
Total Protein: 7.3 g/dL (ref 6.5–8.1)

## 2021-03-11 LAB — CBG MONITORING, ED
Glucose-Capillary: 112 mg/dL — ABNORMAL HIGH (ref 70–99)
Glucose-Capillary: 74 mg/dL (ref 70–99)
Glucose-Capillary: 85 mg/dL (ref 70–99)
Glucose-Capillary: 98 mg/dL (ref 70–99)
Glucose-Capillary: 84 mg/dL (ref 70–99)
Glucose-Capillary: 131 mg/dL — ABNORMAL HIGH (ref 70–99)

## 2021-03-11 LAB — MAGNESIUM
Magnesium: 1.9 mg/dL (ref 1.7–2.4)
Magnesium: 2 mg/dL (ref 1.7–2.4)

## 2021-03-11 LAB — PHOSPHORUS: Phosphorus: 3.5 mg/dL (ref 2.5–4.6)

## 2021-03-11 LAB — RESP PANEL BY RT-PCR (FLU A&B, COVID) ARPGX2
Influenza A by PCR: NEGATIVE
Influenza B by PCR: NEGATIVE
SARS Coronavirus 2 by RT PCR: NEGATIVE

## 2021-03-11 LAB — ETHANOL: Alcohol, Ethyl (B): 171 mg/dL — ABNORMAL HIGH (ref <10)

## 2021-03-11 LAB — HEMOGLOBIN A1C
Hgb A1c MFr Bld: 6.7 % — ABNORMAL HIGH (ref 4.8–5.6)
Mean Plasma Glucose: 145.59 mg/dL

## 2021-03-11 LAB — GLUCOSE, CAPILLARY: Glucose-Capillary: 107 mg/dL — ABNORMAL HIGH (ref 70–99)

## 2021-03-11 LAB — PROTIME-INR
INR: 1 (ref 0.8–1.2)
Prothrombin Time: 13.2 seconds (ref 11.4–15.2)

## 2021-03-11 MED ORDER — HYDROMORPHONE HCL 1 MG/ML IJ SOLN
0.5000 mg | INTRAMUSCULAR | Status: DC | PRN
  Administered 2021-03-11 – 2021-03-13 (×21): 0.5 mg via INTRAVENOUS
  Filled 2021-03-11: qty 1
  Filled 2021-03-11: qty 0.5
  Filled 2021-03-11: qty 1
  Filled 2021-03-11 (×3): qty 0.5
  Filled 2021-03-11: qty 1
  Filled 2021-03-11: qty 0.5
  Filled 2021-03-11: qty 1
  Filled 2021-03-11 (×3): qty 0.5
  Filled 2021-03-11: qty 1
  Filled 2021-03-11 (×2): qty 0.5
  Filled 2021-03-11: qty 1
  Filled 2021-03-11 (×4): qty 0.5
  Filled 2021-03-11: qty 1

## 2021-03-11 MED ORDER — DEXTROSE 50 % IV SOLN
12.5000 g | INTRAVENOUS | Status: AC
  Administered 2021-03-11: 12.5 g via INTRAVENOUS
  Filled 2021-03-11: qty 50

## 2021-03-11 MED ORDER — ACETAMINOPHEN 325 MG PO TABS
650.0000 mg | ORAL_TABLET | Freq: Four times a day (QID) | ORAL | Status: DC | PRN
  Administered 2021-03-11 (×2): 650 mg via ORAL
  Filled 2021-03-11 (×2): qty 2

## 2021-03-11 MED ORDER — INSULIN PUMP
SUBCUTANEOUS | Status: DC
  Administered 2021-03-12: 2.5 via SUBCUTANEOUS
  Administered 2021-03-13: 1.25 via SUBCUTANEOUS
  Administered 2021-03-13: 1.5 via SUBCUTANEOUS
  Administered 2021-03-13: 1.25 via SUBCUTANEOUS
  Administered 2021-03-14 (×2): 1 via SUBCUTANEOUS
  Filled 2021-03-11: qty 1

## 2021-03-11 MED ORDER — NALOXONE HCL 0.4 MG/ML IJ SOLN
0.4000 mg | INTRAMUSCULAR | Status: DC | PRN
Start: 2021-03-11 — End: 2021-03-14

## 2021-03-11 MED ORDER — ACETAMINOPHEN 650 MG RE SUPP: 650.0000 mg | Freq: Four times a day (QID) | RECTAL | Status: DC | PRN

## 2021-03-11 MED ORDER — THIAMINE HCL 100 MG/ML IJ SOLN
Freq: Once | INTRAVENOUS | Status: AC
  Filled 2021-03-11: qty 1000

## 2021-03-11 MED ORDER — ONDANSETRON HCL 4 MG/2ML IJ SOLN
4.0000 mg | Freq: Four times a day (QID) | INTRAMUSCULAR | Status: DC | PRN
  Administered 2021-03-11 (×2): 4 mg via INTRAVENOUS
  Filled 2021-03-11 (×2): qty 2

## 2021-03-11 NOTE — ED Provider Notes (Signed)
I received the patient in signout from Dr. Bernette Mayers, briefly the patient is a 40 year old female with a complicated ankle fracture dislocation that was unable to be reduced in the ED.  After discussion with the orthopedist on-call, Dr. Shon Baton plan for medical admission with orthopedic evaluation in the morning.  Lab work with mild leukocytosis, no significant electrolyte abnormality.   Melene Plan, DO 03/11/21 0045

## 2021-03-11 NOTE — Progress Notes (Addendum)
Inpatient Diabetes Program Recommendations  AACE/ADA: New Consensus Statement on Inpatient Glycemic Control (2015)  Target Ranges:  Prepandial:   less than 140 mg/dL      Peak postprandial:   less than 180 mg/dL (1-2 hours)      Critically ill patients:  140 - 180 mg/dL   Lab Results  Component Value Date   GLUCAP 84 03/11/2021   HGBA1C 6.7 (H) 03/11/2021    Review of Glycemic Control  Diabetes history: DM1 Outpatient Diabetes medications: Insulin pump Current orders for Inpatient glycemic control: Insulin Pump  Inpatient Diabetes Program Recommendations:   Patient sees Dr. Tedd Sias for endocrinology. Last documented insulin pump settings on 08/28/20: MiniMed Medtronic 670G pump  Basal rates 12 AM 1.0 unit/hour 6 AM 1.1 unit/hour 9 AM 1.05 unit/hr 24-hour total basal insulin 25.05 units  Bolus settings I:C ratio at 12 AM is 10, at 4 PM is 9 Sensitivity at 12 AM is 57 BG target at 12 AM is 115-125, target at 6 AM is 95-105 Insulin on board duration = 4 hrs Dr. Tedd Sias faxed statement of necessity for change to tandem insulin pump on 09/23/20. Will clarify with patient.  Noted patient for ankle repair. If surgery >2 hrs., consider change to IV insulin and if under 2 hrs., continue on insulin pump.  Spoke with patient regarding diabetes management. Patient has received her Tandem insulin pump with Dexcom G 6 sensor. States she just took her transmitter off going to CT scan, but has called a neighbor to bring her another sensor. Patient has transmitter @ bedside. Reviewed with patient if she has to take off transmitter anytime during hospitalization to keep safe by locking with belongings or other safe place.  Thank you, [name]. Cola Gane, RN, MSN, CDE  Diabetes Coordinator Inpatient Glycemic Control Team Team Pager [phone] (8am-5pm) 03/11/2021 12:02 PM

## 2021-03-11 NOTE — Consult Note (Addendum)
Patient ID: GINIA RUDELL MRN: [id number] DOB/AGE: 42/22/1982 42 y.o.  Admit date: 03/10/2021  Admission Diagnoses:  Principal Problem:   Closed right ankle fracture Active Problems:   Alcohol dependence (HCC)   Type 1 diabetes mellitus (HCC)   Presence of insulin pump   Leukocytosis   HPI: The patient fell on 03/10/21 evening outside her apartment after slipping on a pile of leaves. She noticed immediate deformity of right leg and was taken to [facility] ER. The patient was closed reduced and splinted. She subsequently underwent CT right ankle.  PMH is notable for type 1 DM (on insulin via pump, pt reports most recent A1c was 6.9), cigarette smoker (current, few cigs daily). She also takes an OCP. She denies other medical issues including cardiac, blood clots, other foot & ankle surgery. She has had Morton neuroma in right foot which occasionally bothers her.   She lives by herself in [address] and works as a Psychologist, prison and probation services all day.  Cell: [phone]  Past Medical History: Past Medical History:  Diagnosis Date   Hyperlipidemia    Type I (juvenile type) diabetes mellitus with hyperosmolarity, not stated as uncontrolled     Surgical History: History reviewed. No pertinent surgical history.  Family History: Family History  Problem Relation Age of Onset   Alcohol abuse Father    Alcohol abuse Sister     Social History: Social History   Socioeconomic History   Marital status: Single    Spouse name: Not on file   Number of children: Not on file   Years of education: Not on file   Highest education level: Not on file  Occupational History   Not on file  Tobacco Use   Smoking status: Former    Packs/day: 1.50    Types: Cigarettes    Start date: 08/08/1994    Quit date: 08/08/2010    Years since quitting: 10.5   Smokeless tobacco: Current   Tobacco comments:    vapor  Substance and Sexual Activity   Alcohol use: Yes    Alcohol/week: 20.0 standard  drinks    Types: 20 Standard drinks or equivalent per week   Drug use: Yes    Frequency: 2.0 times per week    Types: Marijuana   Sexual activity: Not Currently  Other Topics Concern   Not on file  Social History Narrative   Not on file   Social Determinants of Health   Financial Resource Strain: Not on file  Food Insecurity: Not on file  Transportation Needs: Not on file  Physical Activity: Not on file  Stress: Not on file  Social Connections: Not on file  Intimate Partner Violence: Not on file    Allergies: Patient has no known allergies.  Medications: I have reviewed the patient's current medications.  Vital Signs: Patient Vitals for the past 24 hrs:  BP Temp Temp src Pulse Resp SpO2 Height Weight  03/11/21 0800 (!) 152/67 -- -- 73 20 96 % -- --  03/11/21 0700 (!) 147/76 -- -- 80 18 97 % -- --  03/11/21 0505 (!) 151/86 -- -- (!) 101 18 98 % -- --  03/11/21 0300 (!) 142/72 -- -- 77 18 93 % -- --  03/11/21 0230 139/87 -- -- 82 (!) 7 95 % -- --  03/11/21 0215 (!) 141/90 -- -- 91 14 98 % -- --  03/11/21 0030 120/66 -- -- 75 15 100 % -- --  03/11/21 0015 118/63 -- -- -- 17 -- -- --  03/11/21 0000 (!) 145/80 -- -- 84 18 98 % -- --  03/10/21 2315 108/71 -- -- (!) 106 (!) 32 100 % -- --  03/10/21 2248 (!) 146/79 -- -- 76 14 100 % -- --  03/10/21 2219 (!) 147/99 97.9 F (36.6 C) Oral 75 15 100 % -- --  03/10/21 2216 -- -- -- -- -- -- 5\' 2"  (1.575 m) 70.8 kg    Radiology: DG Ankle 2 Views Right  Result Date: 03/10/2021 CLINICAL DATA:  Status post reduction. EXAM: RIGHT ANKLE - 2 VIEW COMPARISON:  March 10, 2021 FINDINGS: The right ankle was imaged in a fiberglass cast with subsequently obscured osseous and soft tissue detail. Acute fracture deformities of the right lateral malleolus and right medial malleolus are seen. An additional fracture of the right posterior malleolus cannot be excluded. Disruption of the right ankle mortise is noted with lateral dislocation of the  right ankle. Diffuse soft tissue swelling is present. IMPRESSION: Acute fracture deformities of the right lateral malleolus and right medial malleolus with disruption of the right ankle mortise and lateral dislocation of the right ankle. Electronically Signed   By: March 12, 2021 M.D.   On: 03/10/2021 23:08   DG Ankle 2 Views Right  Result Date: 03/10/2021 CLINICAL DATA:  Status post fall. EXAM: RIGHT ANKLE - 2 VIEW COMPARISON:  None. FINDINGS: An acute, displaced fracture deformity is seen involving the right lateral malleolus. An additional fracture of the right lateral malleolus is suspected. There is posterior dislocation of the right ankle. Diffuse soft tissue swelling is seen. IMPRESSION: Acute fracture of the right lateral malleolus with posterior dislocation of the right ankle. Electronically Signed   By: 03/12/2021 M.D.   On: 03/10/2021 22:27   CT Ankle Right Wo Contrast  Result Date: 03/10/2021 CLINICAL DATA:  Ankle fracture. EXAM: CT OF THE RIGHT ANKLE WITHOUT CONTRAST TECHNIQUE: Multidetector CT imaging of the right ankle was performed according to the standard protocol. Multiplanar CT image reconstructions were also generated. COMPARISON:  Earlier radiograph dated 03/10/2021. FINDINGS: Bones/Joint/Cartilage There is a displaced fracture of the medial malleolus with proximally 7 mm lateral displacement of the distal fracture fragment. There is a comminuted and displaced fracture of the lateral malleolus. There is 13 mm lateral displacement and 11 mm posterior displacement of the major distal fracture fragment. No other acute fracture. There is destruction of the ankle mortise and approximately 15 mm lateral subluxation of the talus in relation to the tibial plafond. Ligaments Suboptimally assessed by CT. Muscles and Tendons No intramuscular hematoma. Soft tissues Subcutaneous soft tissue edema.  No fluid collection. IMPRESSION: Bimalleolar fracture with disruption of the ankle mortise  and approximately 15 mm lateral subluxation of the talus in relation to the tibial plafond. Electronically Signed   By: 03/12/2021 M.D.   On: 03/10/2021 23:55   DG Chest Port 1 View  Result Date: 03/10/2021 CLINICAL DATA:  Preoperative evaluation. EXAM: PORTABLE CHEST 1 VIEW COMPARISON:  September 11, 2020 FINDINGS: The heart size and mediastinal contours are within normal limits. Both lungs are clear. The visualized skeletal structures are unremarkable. IMPRESSION: No active disease. Electronically Signed   By: September 13, 2020 M.D.   On: 03/10/2021 23:19    Labs: Recent Labs    03/11/21 0002 03/11/21 0509  WBC 18.3* 16.7*  RBC 4.16 4.30  HCT 40.2 41.2  PLT 388 412*   Recent Labs    03/11/21 0002 03/11/21 0509  NA 141 138  K 3.5 3.6  CL 110 106  CO2 20* 20*  BUN 8 7  CREATININE 0.67 0.62  GLUCOSE 115* 80  CALCIUM 8.8* 8.6*   Recent Labs    03/11/21 0509  INR 1.0    Review of Systems: Refer HPI  Physical Exam: Body mass index is 28.53 kg/m.  Physical Exam   Gen: AAOx3, NAD, breathing room air, comfortable at rest  Right lower extremity: Short leg splint in place SILT over toes Wiggles toes CR<2s  Assessment and Plan: The patient is a 40 year old female with right bimalleolar ankle fracture-dislocation with PMH notable for DM type 1 (insulin pump, A1c 6.9), current cigarette smoker.  -currently partially reduced right ankle that remains dislocated in splint -will need surgical fixation of right ankle -we discussed ORIF vs ex fix of right ankle depending on soft tissues at time of surgery -will coordinate with OR control desk on room availability pending staffing and availability -surgery likely on 10/27 or 10/28 afternoon, as she remains dislocated to right ankle will need admission for IV pain control -ok for diet today on 10/26 but keep NPO from midnight -ok for VTE prophylaxis today especially considering OCP use (ok for prophylactic Lovenox/Aspirin  per primary team - please hold on day of surgery) -NWB RLE -strict elevation on 3 pillows to reduce pain and swelling -ice application PRN to reduce pain and swelling  (The risks and benefits were presented and reviewed. The risks due to hardware failure/irritation, infection, stiffness, nerve/vessel/tendon injury, nonunion/malunion, wound healing issues, failure of this surgery, need for further surgery, thromboembolic events, amputation, death among others were discussed. The patient acknowledged the explanation, agreed to proceed with the plan.)   Netta Cedars, MD Orthopaedic Surgeon [facility] [phone] Please secure chat via Epic for non urgent questions as I may be scrubbed into surgery

## 2021-03-11 NOTE — ED Notes (Signed)
RN in process of  getting report.

## 2021-03-11 NOTE — ED Notes (Signed)
Patient states she has had 4.6 Units of insulin since midnight. However at 145 patient stated she had not received insulin. She also states she feels like her blood sugar is decreasing to fast. When asked if she could discontinue to the pump to allow [place] to monitor her blood sugar, the patient stated she did not want to do and she cannot take lantus insulin. Requested PRN order for glucose monitoring and dextrose from the MD.

## 2021-03-11 NOTE — ED Notes (Signed)
Patient changed into gown. Clothes in a belonging bag.

## 2021-03-11 NOTE — Progress Notes (Signed)
  INTERVAL PROGRESS NOTE    Robin Alvarez- 40 y.o. female  LOS: 0 __________________________________________________________________  SUBJECTIVE: Admitted 03/10/2021 with cc of R ankle fx Since admission, patient has been in poorly controlled pain and admits to not knowing her diagnosis.   OBJECTIVE: Blood pressure (!) 151/86, pulse (!) 101, temperature 97.9 F (36.6 C), temperature source Oral, resp. rate 18, height 5\' 2"  (1.575 m), weight 70.8 kg, last menstrual period 03/05/2021, SpO2 98 %.  General: NAD, pleasant, able to participate in exam Cardiac: 2+ radial and PT pulses bilaterally Respiratory: normal effort Extremities: non-pitting edema R LE- in ace bandage. WWP. Skin: warm and dry, no rashes noted Neuro: alert and oriented, no focal deficits Psych: Normal affect and mood   ASSESSMENT/PLAN:  I have reviewed the full H&P by Dr. 03/07/2021, and I agree with the assessment and plan as outlined therein. In addition: - Ortho surgery has evaluated and recommends corrective surgery tomorrow morning. - Patient will have a diet today and n.p.o. at midnight. - Continue close CBG monitoring and use of patient's home insulin pump.  A1c 6.7 on admission showing good control of blood sugars on home regimen. - Analgesia as needed -SCDs for VTE prophylaxis and early ambulation   Principal Problem:   Closed right ankle fracture Active Problems:   Alcohol dependence (HCC)   Type 1 diabetes mellitus (HCC)   Presence of insulin pump   Leukocytosis   Arlean Hopping, DO [facility] 03/11/2021, 10:11 AM    [email or web address] Available by Epic secure chat 7AM-7PM. If 7PM-7AM, please contact night-coverage   No Charge

## 2021-03-11 NOTE — H&P (Signed)
History and Physical    PLEASE NOTE THAT DRAGON DICTATION SOFTWARE WAS USED IN THE CONSTRUCTION OF THIS NOTE.   Robin Alvarez:[id number] DOB: 01/29/1981 DOA: 03/10/2021  PCP: Neldon Labella, PA Patient coming from: home   I have personally briefly reviewed patient's old medical records in [facility]  Chief Complaint: Right ankle pain  HPI: Robin Alvarez is a 40 y.o. female with medical history significant for type 1 diabetes mellitus, chronic alcohol abuse, who is admitted to [facility] on 03/10/2021 with acute bimalleolar right ankle fracture after presenting from home to [facility] ED complaining of right ankle pain.   The patient reports that she is ambulating, walking her dog earlier this evening, when she slipped on some wet leaves, falling onto her right ankle, and developing immediate sharp pain associated with the medial aspect, with radiation proximally up the medial aspect of the right lower extremity.  Denies any associated numbness or paresthesias.  She was subsequently brought to [facility] emergency department via EMS for further evaluation management of acute right ankle discomfort in the setting of the above fall.  She denies any associated loss of consciousness, and confirms that she did not hit her head as a component of this fall.  Aside from her right ankle pain, she denies any additional acute arthralgias or myalgias.  Not on any blood thinners.     ED Course:  Vital signs in the ED were notable for the following: Afebrile, heart rate 75-84; blood pressure 118/63; respiratory 14-18, oxygen saturation 98 200% room air  Labs were notable for the following: CMP bicarbonate 20, anion gap 11, glucose 115, creatinine 0.65.  CBC notable for white cell count 18,000.  Serum ethanol level 171.  Screening COVID-19/evidence of PCR checked in the ED today with result currently pending.   EKG shows sinus rhythm with heart rate 95, normal intervals, no evidence of T  wave or ST changes.  Imaging and additional notable ED work-up: Chest x-ray showed no evidence of acute cardiopulmonary process, will also CT of the right ankle showed bimalleolar fracture with approximately 15 mm lateral subluxation of the talus.  EDP attempted to reduce the right ankle under sedation, but was unsuccessful in doing so.  Discussed the patient's case and imaging with the on-call orthopedic surgeon, Dr. Shon Baton admission to the hospitalist service orthopedic surgery disease morning.      Review of Systems: As per HPI otherwise 10 point review of systems negative.   Past Medical History:  Diagnosis Date   Hyperlipidemia    Type I (juvenile type) diabetes mellitus with hyperosmolarity, not stated as uncontrolled     History reviewed. No pertinent surgical history.  Social History:  reports that she quit smoking about 10 years ago. Her smoking use included cigarettes. She started smoking about 26 years ago. She smoked an average of 1.5 packs per day. She uses smokeless tobacco. She reports current alcohol use of about 20.0 standard drinks per week. She reports current drug use. Frequency: 2.00 times per week. Drug: Marijuana.   No Known Allergies  Family History  Problem Relation Age of Onset   Alcohol abuse Father    Alcohol abuse Sister     Family history reviewed and not pertinent    Prior to Admission medications   Medication Sig Start Date End Date Taking? Authorizing Provider  insulin aspart (NOVOLOG) 100 UNIT/ML injection Inject 0-110 Units into the skin as directed. Use up to 110 units via insulin pump 03/12/20  Yes [provider]  norethindrone-ethinyl estradiol 1/35 (ORTHO-NOVUM) tablet Take 1 tablet by mouth daily. 09/19/17  Yes [provider]  polyvinyl alcohol (LIQUIFILM TEARS) 1.4 % ophthalmic solution Place 1 drop into both eyes as needed for dry eyes.   Yes [provider]     Objective    Physical Exam: Vitals:    03/10/21 2315 03/11/21 0000 03/11/21 0015 03/11/21 0030  BP: 108/71 (!) 145/80 118/63 120/66  Pulse: (!) 106 84  75  Resp: (!) 32 18 17 15   Temp:      TempSrc:      SpO2: 100% 98%  100%  Weight:      Height:        General: appears to be stated age; alert, oriented Skin: warm, dry, no rash Head:  AT/[place] Mouth:  Oral mucosa membranes appear moist, normal dentition Neck: supple; trachea midline Heart:  RRR; did not appreciate any M/R/G Lungs: CTAB, did not appreciate any wheezes, rales, or rhonchi Abdomen: + BS; soft, ND, NT Vascular: 2+ pedal pulses b/l; 2+ radial pulses b/l Extremities: Patient's right ankle dressing, clean, dry, intact. Neuro: strength and sensation intact in upper and lower extremities b/l    Labs on Admission: I have personally reviewed following labs and imaging studies  CBC: Recent Labs  Lab 03/11/21 0002  WBC 18.3*  NEUTROABS 14.8*  HGB 13.6  HCT 40.2  MCV 96.6  PLT 388   Basic Metabolic Panel: Recent Labs  Lab 03/11/21 0002  NA 141  K 3.5  CL 110  CO2 20*  GLUCOSE 115*  BUN 8  CREATININE 0.67  CALCIUM 8.8*   GFR: Estimated Creatinine Clearance: 84.5 mL/min (by C-G formula based on SCr of 0.67 mg/dL). Liver Function Tests: Recent Labs  Lab 03/11/21 0002  AST 20  ALT 18  ALKPHOS 53  BILITOT 0.3  PROT 7.3  ALBUMIN 3.9   No results for input(s): LIPASE, AMYLASE in the last 168 hours. No results for input(s): AMMONIA in the last 168 hours. Coagulation Profile: No results for input(s): INR, PROTIME in the last 168 hours. Cardiac Enzymes: No results for input(s): CKTOTAL, CKMB, CKMBINDEX, TROPONINI in the last 168 hours. BNP (last 3 results) No results for input(s): PROBNP in the last 8760 hours. HbA1C: No results for input(s): HGBA1C in the last 72 hours. CBG: No results for input(s): GLUCAP in the last 168 hours. Lipid Profile: No results for input(s): CHOL, HDL, LDLCALC, TRIG, CHOLHDL, LDLDIRECT in the last 72  hours. Thyroid Function Tests: No results for input(s): TSH, T4TOTAL, FREET4, T3FREE, THYROIDAB in the last 72 hours. Anemia Panel: No results for input(s): VITAMINB12, FOLATE, FERRITIN, TIBC, IRON, RETICCTPCT in the last 72 hours. Urine analysis:    Component Value Date/Time   COLORURINE YELLOW 09/11/2020 1314   APPEARANCEUR CLEAR 09/11/2020 1314   LABSPEC 1.022 09/11/2020 1314   PHURINE 5.0 09/11/2020 1314   GLUCOSEU >=500 (A) 09/11/2020 1314   HGBUR MODERATE (A) 09/11/2020 1314   BILIRUBINUR NEGATIVE 09/11/2020 1314   KETONESUR 80 (A) 09/11/2020 1314   PROTEINUR NEGATIVE 09/11/2020 1314   NITRITE NEGATIVE 09/11/2020 1314   LEUKOCYTESUR NEGATIVE 09/11/2020 1314    Radiological Exams on Admission: DG Ankle 2 Views Right  Result Date: 03/10/2021 CLINICAL DATA:  Status post reduction. EXAM: RIGHT ANKLE - 2 VIEW COMPARISON:  March 10, 2021 FINDINGS: The right ankle was imaged in a fiberglass cast with subsequently obscured osseous and soft tissue detail. Acute fracture deformities of the right lateral malleolus  and right medial malleolus are seen. An additional fracture of the right posterior malleolus cannot be excluded. Disruption of the right ankle mortise is noted with lateral dislocation of the right ankle. Diffuse soft tissue swelling is present. IMPRESSION: Acute fracture deformities of the right lateral malleolus and right medial malleolus with disruption of the right ankle mortise and lateral dislocation of the right ankle. Electronically Signed   By: Aram Candela M.D.   On: 03/10/2021 23:08   DG Ankle 2 Views Right  Result Date: 03/10/2021 CLINICAL DATA:  Status post fall. EXAM: RIGHT ANKLE - 2 VIEW COMPARISON:  None. FINDINGS: An acute, displaced fracture deformity is seen involving the right lateral malleolus. An additional fracture of the right lateral malleolus is suspected. There is posterior dislocation of the right ankle. Diffuse soft tissue swelling is seen.  IMPRESSION: Acute fracture of the right lateral malleolus with posterior dislocation of the right ankle. Electronically Signed   By: Aram Candela M.D.   On: 03/10/2021 22:27   CT Ankle Right Wo Contrast  Result Date: 03/10/2021 CLINICAL DATA:  Ankle fracture. EXAM: CT OF THE RIGHT ANKLE WITHOUT CONTRAST TECHNIQUE: Multidetector CT imaging of the right ankle was performed according to the standard protocol. Multiplanar CT image reconstructions were also generated. COMPARISON:  Earlier radiograph dated 03/10/2021. FINDINGS: Bones/Joint/Cartilage There is a displaced fracture of the medial malleolus with proximally 7 mm lateral displacement of the distal fracture fragment. There is a comminuted and displaced fracture of the lateral malleolus. There is 13 mm lateral displacement and 11 mm posterior displacement of the major distal fracture fragment. No other acute fracture. There is destruction of the ankle mortise and approximately 15 mm lateral subluxation of the talus in relation to the tibial plafond. Ligaments Suboptimally assessed by CT. Muscles and Tendons No intramuscular hematoma. Soft tissues Subcutaneous soft tissue edema.  No fluid collection. IMPRESSION: Bimalleolar fracture with disruption of the ankle mortise and approximately 15 mm lateral subluxation of the talus in relation to the tibial plafond. Electronically Signed   By: Elgie Collard M.D.   On: 03/10/2021 23:55   DG Chest Port 1 View  Result Date: 03/10/2021 CLINICAL DATA:  Preoperative evaluation. EXAM: PORTABLE CHEST 1 VIEW COMPARISON:  September 11, 2020 FINDINGS: The heart size and mediastinal contours are within normal limits. Both lungs are clear. The visualized skeletal structures are unremarkable. IMPRESSION: No active disease. Electronically Signed   By: Aram Candela M.D.   On: 03/10/2021 23:19     EKG: Independently reviewed, with result as described above.    Assessment/Plan    Principal Problem:   Closed  right ankle fracture Active Problems:   Alcohol dependence (HCC)   Type 1 diabetes mellitus (HCC)   Presence of insulin pump   Leukocytosis     #) Acute bimalleolar right ankle fracture: Incurred earlier this evening incurred via ground-level mechanical fall earlier today, in compartment CT right ankle.  Unsuccessful reduction by EDP this evening under sedation.  DC imaging discussed with orthopedic surgery, will formally consult and see the patient in the morning, anticipating taking patient to the OR later today for definitive surgical correction.  Patient currently reports adequate pain control, and right lower extremity neurovascularly intact.  Noted but there is an outpatient.   Plan: NPO.  Formal orthopedic surgery consult.  As needed IV Dilaudid.  Check INR.  Repeat CBC in the morning.     #) Leukocytosis: CBC reflects mildly elevated white blood cell count of 18,000, without any  evidence of underlying infectious process, a chest x-ray showed no evidence of acute cardiopulmonary process.  Patient denies any acute urinary symptoms, and COVID-19/influenza PCR results are currently pending.  STEMI criteria for sepsis.  Rather, suspect that this elevation with blood cell count is reactive in nature, in the setting of ground-level mechanical fall and incentive acute bimalleolar fracture of the right ankle.  Plan: Repeat CBC with differential in the morning.  Follow-up results of COVID-19/influenza PCR.      #) Chronic alcohol abuse: The patient daily consumption of at least 2 glasses of wine 2 AM bottle per day and presentation is noted to be associate with acute alcohol intoxication with serum ethanol level of 171 she confirms most recent alcohol consumed in the afternoon of 03/10/2021.  We will initiate banana bag.  No evidence of active withdrawal at this time.   Banana bag x1, following which we will reevaluate for maintenance of administration of ensuing folic acid/thiamine  supplementation as well as multivitamin.  Constipation importance of reduction in alcohol consumption.  Add on serum magnesium level and serum phosphorus level.       #) Type 1 diabetes: On insulin pump.    Plan: q4-hour Accu-Cheks, with plan for patient to administer corresponding dose of insulin via her insulin pump itself, according to external Accu-Cheks on a every 4 hours basis.  Initial blood pressure noted to be 115.     DVT prophylaxis: scd's   (on left) Code Status: Full code Family Communication: none Disposition Plan: Per Rounding Team Consults called: dr. Shon Baton of ortho, as above;  Admission status: Inpatient; MedSurg     Of note, this patient was added by me to the following Admit List/Treatment Team: [email or web address].    Of note, the Adult Admission Order Set (Multimorbid order set) was used by me in the admission process for this patient.   PLEASE NOTE THAT DRAGON DICTATION SOFTWARE WAS USED IN THE CONSTRUCTION OF THIS NOTE.   Angie Fava DO [facility] Pager [phone] From 7PM - 7AM   03/11/2021, 1:02 AM

## 2021-03-12 ENCOUNTER — Other Ambulatory Visit: Payer: Self-pay

## 2021-03-12 DIAGNOSIS — E109 Type 1 diabetes mellitus without complications: Secondary | ICD-10-CM | POA: Diagnosis not present

## 2021-03-12 DIAGNOSIS — F1020 Alcohol dependence, uncomplicated: Secondary | ICD-10-CM

## 2021-03-12 DIAGNOSIS — Z9641 Presence of insulin pump (external) (internal): Secondary | ICD-10-CM | POA: Diagnosis not present

## 2021-03-12 DIAGNOSIS — S82891A Other fracture of right lower leg, initial encounter for closed fracture: Secondary | ICD-10-CM | POA: Diagnosis not present

## 2021-03-12 LAB — GLUCOSE, CAPILLARY
Glucose-Capillary: 118 mg/dL — ABNORMAL HIGH (ref 70–99)
Glucose-Capillary: 161 mg/dL — ABNORMAL HIGH (ref 70–99)
Glucose-Capillary: 180 mg/dL — ABNORMAL HIGH (ref 70–99)
Glucose-Capillary: 200 mg/dL — ABNORMAL HIGH (ref 70–99)
Glucose-Capillary: 85 mg/dL (ref 70–99)
Glucose-Capillary: 87 mg/dL (ref 70–99)

## 2021-03-12 LAB — SURGICAL PCR SCREEN
MRSA, PCR: NEGATIVE
Staphylococcus aureus: NEGATIVE

## 2021-03-12 MED ORDER — CHLORHEXIDINE GLUCONATE 4 % EX LIQD: 60.0000 mL | Freq: Once | CUTANEOUS | Status: DC

## 2021-03-12 MED ORDER — POVIDONE-IODINE 10 % EX SWAB: 2.0000 "application " | Freq: Once | CUTANEOUS | Status: DC

## 2021-03-12 MED ORDER — DEXTROSE-NACL 5-0.9 % IV SOLN: INTRAVENOUS | Status: DC

## 2021-03-12 MED ORDER — OXYCODONE HCL 5 MG PO TABS
5.0000 mg | ORAL_TABLET | Freq: Four times a day (QID) | ORAL | Status: DC | PRN
  Administered 2021-03-13: 5 mg via ORAL
  Filled 2021-03-12: qty 1

## 2021-03-12 MED ORDER — CEFAZOLIN SODIUM-DEXTROSE 2-4 GM/100ML-% IV SOLN
2.0000 g | INTRAVENOUS | Status: AC
  Administered 2021-03-13: 2 g via INTRAVENOUS
  Filled 2021-03-12: qty 100

## 2021-03-12 MED ORDER — HYDROCODONE-ACETAMINOPHEN 5-325 MG PO TABS
1.0000 | ORAL_TABLET | Freq: Four times a day (QID) | ORAL | Status: AC
  Administered 2021-03-12 – 2021-03-13 (×5): 1 via ORAL
  Filled 2021-03-12 (×5): qty 1

## 2021-03-12 MED ORDER — GABAPENTIN 300 MG PO CAPS
300.0000 mg | ORAL_CAPSULE | Freq: Three times a day (TID) | ORAL | Status: DC
  Administered 2021-03-12 – 2021-03-14 (×7): 300 mg via ORAL
  Filled 2021-03-12 (×8): qty 1

## 2021-03-12 MED FILL — Insulin Aspart Inj Soln 100 Unit/ML: INTRAMUSCULAR | Qty: 10 | Status: AC

## 2021-03-12 NOTE — Plan of Care (Signed)

## 2021-03-12 NOTE — Progress Notes (Signed)
PROGRESS NOTE  Robin Alvarez    DOB: 1980-06-01, 40 y.o.  LGX:[id number]  PCP: Neldon Labella, PA   Code Status: Full Code   DOA: 03/10/2021   LOS: 1  Brief Narrative of Current Hospitalization  Robin Alvarez is a 40 y.o. female with a PMH significant for type I DM, chronic alcohol dependence. They presented from home to the ED on 03/10/2021 with right bimalleolar ankle fracture after tripping.  They were treated with pain control.  Patient was admitted to medicine service for further workup and management of chronic conditions as outlined in detail below.  03/12/21 -stable  Assessment & Plan  Principal Problem:   Closed right ankle fracture Active Problems:   Alcohol dependence (HCC)   Type 1 diabetes mellitus (HCC)   Presence of insulin pump   Leukocytosis   Right bimalleolar ankle fracture -Ortho following, appreciate recommendations  -Possible OR today.  N.p.o. and holding anticoagulation -Modified analgesia to oxycodone and added gabapentin  Type I DM-well-controlled -IV hydration with D5 normal saline to avoid hypoglycemia while patient is n.p.o. and so that she will get insulin throughout the day  DVT prophylaxis: SCDs Start: 03/11/21 0101  Diet:  Diet Orders (From admission, onward)     Start     Ordered   03/12/21 0001  Diet NPO time specified  Diet effective midnight        03/11/21 1017            Subjective 03/12/21    Pt reports still in considerable amount of pain in her right ankle which breakthrough in between timing of her pain medications.  Disposition Plan & Communication  Patient status: Inpatient  Admitted From: Home Disposition: Home Anticipated discharge date: 10/29  Family Communication: None Consults, Procedures, Significant Events  Consultants:  Ortho  Procedures/significant events:  Fixation date TBD  Objective   Vitals:   03/11/21 1800 03/11/21 2039 03/11/21 2303 03/12/21 0424  BP: 138/69 (!) 154/73 (!) 152/70  140/70  Pulse: 76 79 72 70  Resp: 12 16 16 16   Temp:  98.4 F (36.9 C) 98.8 F (37.1 C) 98.4 F (36.9 C)  TempSrc:  Oral Oral Oral  SpO2: 97% 100% 100% 99%  Weight:      Height:        Intake/Output Summary (Last 24 hours) at 03/12/2021 03/14/2021 Last data filed at 03/12/2021 0426 Gross per 24 hour  Intake 0 ml  Output 700 ml  Net -700 ml   Filed Weights   03/10/21 2216  Weight: 70.8 kg    Patient BMI: Body mass index is 28.53 kg/m.   Physical Exam: General: awake, alert, mild distress Respiratory: normal respiratory effort. Cardiovascular: quick capillary refill  Nervous: A&O x3. no gross focal neurologic deficits, normal speech Extremities: normal tone, foot has good capillary refill, sensation, range of motion of toes distal to injury. Skin: dry, intact, normal temperature, normal color, No rashes, lesions or ulcers Psychiatry: Mildly anxious, agitated due to pain and frustration with not having her surgery planned yet  Labs   I have personally reviewed following labs and imaging studies Admission on 03/10/2021  Component Date Value Ref Range Status   WBC 03/11/2021 18.3 (A)  4.0 - 10.5 K/uL Final   RBC 03/11/2021 4.16  3.87 - 5.11 MIL/uL Final   Hemoglobin 03/11/2021 13.6  12.0 - 15.0 g/dL Final   HCT 03/13/2021 40.2  36.0 - 46.0 % Final   MCV 03/11/2021 96.6  80.0 - 100.0 fL Final  MCH 03/11/2021 32.7  26.0 - 34.0 pg Final   MCHC 03/11/2021 33.8  30.0 - 36.0 g/dL Final   RDW 32/95/1884 13.2  11.5 - 15.5 % Final   Platelets 03/11/2021 388  150 - 400 K/uL Final   nRBC 03/11/2021 0.0  0.0 - 0.2 % Final   Neutrophils Relative % 03/11/2021 81  % Final   Neutro Abs 03/11/2021 14.8 (A)  1.7 - 7.7 K/uL Final   Lymphocytes Relative 03/11/2021 15  % Final   Lymphs Abs 03/11/2021 2.7  0.7 - 4.0 K/uL Final   Monocytes Relative 03/11/2021 3  % Final   Monocytes Absolute 03/11/2021 0.6  0.1 - 1.0 K/uL Final   Eosinophils Relative 03/11/2021 0  % Final   Eosinophils Absolute  03/11/2021 0.1  0.0 - 0.5 K/uL Final   Basophils Relative 03/11/2021 0  % Final   Basophils Absolute 03/11/2021 0.1  0.0 - 0.1 K/uL Final   Immature Granulocytes 03/11/2021 1  % Final   Abs Immature Granulocytes 03/11/2021 0.11 (A)  0.00 - 0.07 K/uL Final   SARS Coronavirus 2 by RT PCR 03/11/2021 NEGATIVE  NEGATIVE Final   Influenza A by PCR 03/11/2021 NEGATIVE  NEGATIVE Final   Influenza B by PCR 03/11/2021 NEGATIVE  NEGATIVE Final   Sodium 03/11/2021 141  135 - 145 mmol/L Final   Potassium 03/11/2021 3.5  3.5 - 5.1 mmol/L Final   Chloride 03/11/2021 110  98 - 111 mmol/L Final   CO2 03/11/2021 20 (A)  22 - 32 mmol/L Final   Glucose, Bld 03/11/2021 115 (A)  70 - 99 mg/dL Final   BUN 16/60/6301 8  6 - 20 mg/dL Final   Creatinine, Ser 03/11/2021 0.67  0.44 - 1.00 mg/dL Final   Calcium 60/02/9322 8.8 (A)  8.9 - 10.3 mg/dL Final   Total Protein 55/73/2202 7.3  6.5 - 8.1 g/dL Final   Albumin 54/27/0623 3.9  3.5 - 5.0 g/dL Final   AST 76/28/3151 20  15 - 41 U/L Final   ALT 03/11/2021 18  0 - 44 U/L Final   Alkaline Phosphatase 03/11/2021 53  38 - 126 U/L Final   Total Bilirubin 03/11/2021 0.3  0.3 - 1.2 mg/dL Final   GFR, Estimated 03/11/2021 >60  >60 mL/min Final   Anion gap 03/11/2021 11  5 - 15 Final   Alcohol, Ethyl (B) 03/11/2021 171 (A)  <10 mg/dL Final   Prothrombin Time 03/11/2021 13.2  11.4 - 15.2 seconds Final   INR 03/11/2021 1.0  0.8 - 1.2 Final   Magnesium 03/11/2021 2.0  1.7 - 2.4 mg/dL Final   Magnesium 76/16/0737 1.9  1.7 - 2.4 mg/dL Final   Sodium 10/62/6948 138  135 - 145 mmol/L Final   Potassium 03/11/2021 3.6  3.5 - 5.1 mmol/L Final   Chloride 03/11/2021 106  98 - 111 mmol/L Final   CO2 03/11/2021 20 (A)  22 - 32 mmol/L Final   Glucose, Bld 03/11/2021 80  70 - 99 mg/dL Final   BUN 54/62/7035 7  6 - 20 mg/dL Final   Creatinine, Ser 03/11/2021 0.62  0.44 - 1.00 mg/dL Final   Calcium 00/93/8182 8.6 (A)  8.9 - 10.3 mg/dL Final   Total Protein 99/37/1696 7.3  6.5 - 8.1  g/dL Final   Albumin 78/93/8101 3.9  3.5 - 5.0 g/dL Final   AST 75/02/2584 21  15 - 41 U/L Final   ALT 03/11/2021 18  0 - 44 U/L Final   Alkaline Phosphatase  03/11/2021 58  38 - 126 U/L Final   Total Bilirubin 03/11/2021 0.3  0.3 - 1.2 mg/dL Final   GFR, Estimated 03/11/2021 >60  >60 mL/min Final   Anion gap 03/11/2021 12  5 - 15 Final   WBC 03/11/2021 16.7 (A)  4.0 - 10.5 K/uL Final   RBC 03/11/2021 4.30  3.87 - 5.11 MIL/uL Final   Hemoglobin 03/11/2021 13.7  12.0 - 15.0 g/dL Final   HCT 02/40/9735 41.2  36.0 - 46.0 % Final   MCV 03/11/2021 95.8  80.0 - 100.0 fL Final   MCH 03/11/2021 31.9  26.0 - 34.0 pg Final   MCHC 03/11/2021 33.3  30.0 - 36.0 g/dL Final   RDW 32/99/2426 13.5  11.5 - 15.5 % Final   Platelets 03/11/2021 412 (A)  150 - 400 K/uL Final   nRBC 03/11/2021 0.0  0.0 - 0.2 % Final   Neutrophils Relative % 03/11/2021 70  % Final   Neutro Abs 03/11/2021 11.8 (A)  1.7 - 7.7 K/uL Final   Lymphocytes Relative 03/11/2021 22  % Final   Lymphs Abs 03/11/2021 3.6  0.7 - 4.0 K/uL Final   Monocytes Relative 03/11/2021 7  % Final   Monocytes Absolute 03/11/2021 1.1 (A)  0.1 - 1.0 K/uL Final   Eosinophils Relative 03/11/2021 1  % Final   Eosinophils Absolute 03/11/2021 0.1  0.0 - 0.5 K/uL Final   Basophils Relative 03/11/2021 0  % Final   Basophils Absolute 03/11/2021 0.1  0.0 - 0.1 K/uL Final   Immature Granulocytes 03/11/2021 0  % Final   Abs Immature Granulocytes 03/11/2021 0.07  0.00 - 0.07 K/uL Final   Phosphorus 03/11/2021 3.5  2.5 - 4.6 mg/dL Final   Hgb S3M MFr Bld 03/11/2021 6.7 (A)  4.8 - 5.6 % Final   Mean Plasma Glucose 03/11/2021 145.59  mg/dL Final   Glucose-Capillary 03/11/2021 112 (A)  70 - 99 mg/dL Final   Glucose-Capillary 03/11/2021 74  70 - 99 mg/dL Final   Glucose-Capillary 03/11/2021 85  70 - 99 mg/dL Final   Glucose-Capillary 03/11/2021 98  70 - 99 mg/dL Final   Glucose-Capillary 03/11/2021 84  70 - 99 mg/dL Final   Glucose-Capillary 03/11/2021 131 (A)  70  - 99 mg/dL Final   Glucose-Capillary 03/11/2021 107 (A)  70 - 99 mg/dL Final   MRSA, PCR 19/62/2297 NEGATIVE  NEGATIVE Final   Staphylococcus aureus 03/12/2021 NEGATIVE  NEGATIVE Final   Glucose-Capillary 03/12/2021 87  70 - 99 mg/dL Final    Imaging Studies  No results found. Medications   Scheduled Meds:  insulin pump   Subcutaneous Q4H   No recently discontinued medications to reconcile  LOS: 1 day   Time spent: >6min  Leeroy Bock, DO [facility] 03/12/2021, 7:12 AM   Please refer to amion to contact the [facility] Attending or Consulting provider for this pt  [email or web address] Available by Epic secure chat 7AM-7PM. If 7PM-7AM, please contact night-coverage

## 2021-03-12 NOTE — Care Plan (Addendum)
PLAN OF CARE Orthopaedic Surgery  -pt will undergo ORIF right ankle tomorrow 03/13/21 by Dr. Duwayne Heck, likely in afternoon -please keep NPO from midnight -consent order and other preop orders signed and held under case request for tomorrow -discussed directly with patient who is in agreement with plan -OR control desk aware and adding on for Dr. Adalberto Cole, MD Orthopaedic Surgery [facility]

## 2021-03-12 NOTE — Plan of Care (Signed)
  Problem: Education: Goal: Knowledge of General Education information will improve Description: Including pain rating scale, medication(s)/side effects and non-pharmacologic comfort measures Outcome: Progressing   Problem: Pain Managment: Goal: General experience of comfort will improve Outcome: Progressing   Problem: Safety: Goal: Ability to remain free from injury will improve Outcome: Progressing   

## 2021-03-12 NOTE — Progress Notes (Signed)
Subjective:     Patient reports pain as mild-moderate. Elevating limb on multiple pillows. NPO since midnight for possible OR today.  Objective:   VITALS:  Temp:  [98.4 F (36.9 C)-98.8 F (37.1 C)] 98.4 F (36.9 C) (10/27 0424) Pulse Rate:  [64-89] 70 (10/27 0424) Resp:  [12-23] 16 (10/27 0424) BP: (138-159)/(60-82) 140/70 (10/27 0424) SpO2:  [95 %-100 %] 99 % (10/27 0424)  Physical Exam    Gen: AAOx3, NAD, breathing room air, comfortable at rest   Right lower extremity: Short leg splint in place SILT over toes Wiggles toes CR<2s   LABS Recent Labs    03/11/21 0002 03/11/21 0509  HGB 13.6 13.7  WBC 18.3* 16.7*  PLT 388 412*   Recent Labs    03/11/21 0002 03/11/21 0509  NA 141 138  K 3.5 3.6  CL 110 106  CO2 20* 20*  BUN 8 7  CREATININE 0.67 0.62  GLUCOSE 115* 80   Recent Labs    03/11/21 0509  INR 1.0     Assessment/Plan:     The patient is a 40 year old female with right bimalleolar ankle fracture-dislocation with PMH notable for DM type 1 (insulin pump, A1c 6.9), current cigarette smoker.   -currently partially reduced right ankle that remains dislocated in splint -will need surgical fixation of right ankle -we discussed ORIF vs ex fix of right ankle depending on soft tissues at time of surgery -will coordinate with OR control desk on room availability pending staffing and availability -surgery likely today or 10/28 afternoon, as she remains dislocated to right ankle will need admission for IV pain control -keep NPO until otherwise notified -hold VTE prophylaxis today for possible OR -NWB RLE -strict elevation on 3 pillows to reduce pain and swelling -ice application PRN to reduce pain and swelling  Netta Cedars 03/12/2021, 7:33 AM

## 2021-03-12 NOTE — Plan of Care (Signed)
  Problem: Pain Managment: Goal: General experience of comfort will improve Outcome: Progressing   

## 2021-03-13 ENCOUNTER — Encounter (HOSPITAL_COMMUNITY): Payer: Self-pay | Admitting: Internal Medicine

## 2021-03-13 ENCOUNTER — Inpatient Hospital Stay (HOSPITAL_COMMUNITY): Payer: 59 | Admitting: Anesthesiology

## 2021-03-13 ENCOUNTER — Encounter (HOSPITAL_COMMUNITY)
Admission: EM | Disposition: A | Discharge status: Home / Self Care | Payer: Self-pay | Source: Home / Self Care | Attending: Student in an Organized Health Care Education/Training Program

## 2021-03-13 ENCOUNTER — Inpatient Hospital Stay (HOSPITAL_COMMUNITY): Payer: 59

## 2021-03-13 DIAGNOSIS — S82891A Other fracture of right lower leg, initial encounter for closed fracture: Secondary | ICD-10-CM | POA: Diagnosis not present

## 2021-03-13 DIAGNOSIS — E109 Type 1 diabetes mellitus without complications: Secondary | ICD-10-CM | POA: Diagnosis not present

## 2021-03-13 DIAGNOSIS — F1020 Alcohol dependence, uncomplicated: Secondary | ICD-10-CM | POA: Diagnosis not present

## 2021-03-13 DIAGNOSIS — Z9641 Presence of insulin pump (external) (internal): Secondary | ICD-10-CM | POA: Diagnosis not present

## 2021-03-13 HISTORY — PX: ORIF ANKLE FRACTURE: SHX5408

## 2021-03-13 LAB — CBC
HCT: 41.3 % (ref 36.0–46.0)
Hemoglobin: 13.3 g/dL (ref 12.0–15.0)
MCH: 31.7 pg (ref 26.0–34.0)
MCHC: 32.2 g/dL (ref 30.0–36.0)
MCV: 98.3 fL (ref 80.0–100.0)
Platelets: 323 10*3/uL (ref 150–400)
RBC: 4.2 MIL/uL (ref 3.87–5.11)
RDW: 13.1 % (ref 11.5–15.5)
WBC: 9.2 10*3/uL (ref 4.0–10.5)
nRBC: 0 % (ref 0.0–0.2)

## 2021-03-13 LAB — CREATININE, SERUM
Creatinine, Ser: 0.64 mg/dL (ref 0.44–1.00)
GFR, Estimated: >60 mL/min (ref >60)

## 2021-03-13 LAB — GLUCOSE, CAPILLARY
Glucose-Capillary: 220 mg/dL — ABNORMAL HIGH (ref 70–99)
Glucose-Capillary: 195 mg/dL — ABNORMAL HIGH (ref 70–99)
Glucose-Capillary: 142 mg/dL — ABNORMAL HIGH (ref 70–99)
Glucose-Capillary: 178 mg/dL — ABNORMAL HIGH (ref 70–99)
Glucose-Capillary: 307 mg/dL — ABNORMAL HIGH (ref 70–99)
Glucose-Capillary: 361 mg/dL — ABNORMAL HIGH (ref 70–99)

## 2021-03-13 LAB — HCG, SERUM, QUALITATIVE: Preg, Serum: NEGATIVE

## 2021-03-13 SURGERY — OPEN REDUCTION INTERNAL FIXATION (ORIF) ANKLE FRACTURE
Anesthesia: General | Site: Ankle | Laterality: Right

## 2021-03-13 MED ORDER — DEXAMETHASONE SODIUM PHOSPHATE 10 MG/ML IJ SOLN
INTRAMUSCULAR | Status: AC
  Filled 2021-03-13: qty 1

## 2021-03-13 MED ORDER — ONDANSETRON HCL 4 MG PO TABS: 4.0000 mg | ORAL_TABLET | Freq: Four times a day (QID) | ORAL | Status: DC | PRN

## 2021-03-13 MED ORDER — EPHEDRINE SULFATE 50 MG/ML IJ SOLN
INTRAMUSCULAR | Status: DC | PRN
  Administered 2021-03-13: 7 mg via INTRAVENOUS

## 2021-03-13 MED ORDER — DOCUSATE SODIUM 100 MG PO CAPS
100.0000 mg | ORAL_CAPSULE | Freq: Two times a day (BID) | ORAL | Status: DC
  Administered 2021-03-13 – 2021-03-14 (×2): 100 mg via ORAL
  Filled 2021-03-13 (×2): qty 1

## 2021-03-13 MED ORDER — ONDANSETRON HCL 4 MG/2ML IJ SOLN
INTRAMUSCULAR | Status: AC
  Filled 2021-03-13: qty 2

## 2021-03-13 MED ORDER — PHENYLEPHRINE HCL (PRESSORS) 10 MG/ML IV SOLN
INTRAVENOUS | Status: DC | PRN
  Administered 2021-03-13: 100 ug via INTRAVENOUS

## 2021-03-13 MED ORDER — 0.9 % SODIUM CHLORIDE (POUR BTL) OPTIME
TOPICAL | Status: DC | PRN
  Administered 2021-03-13: 1000 mL

## 2021-03-13 MED ORDER — CLONIDINE HCL (ANALGESIA) 100 MCG/ML EP SOLN
EPIDURAL | Status: DC | PRN
  Administered 2021-03-13: 50 ug
  Administered 2021-03-13: 30 ug

## 2021-03-13 MED ORDER — DEXAMETHASONE SODIUM PHOSPHATE 4 MG/ML IJ SOLN
INTRAMUSCULAR | Status: DC | PRN
  Administered 2021-03-13: 3.5 mg via PERINEURAL
  Administered 2021-03-13: 8 mg via PERINEURAL
  Administered 2021-03-13: 2.5 mg via PERINEURAL

## 2021-03-13 MED ORDER — METOCLOPRAMIDE HCL 5 MG PO TABS: 5.0000 mg | ORAL_TABLET | Freq: Three times a day (TID) | ORAL | Status: DC | PRN

## 2021-03-13 MED ORDER — PROPOFOL 10 MG/ML IV BOLUS
INTRAVENOUS | Status: AC
  Filled 2021-03-13: qty 20

## 2021-03-13 MED ORDER — FENTANYL CITRATE (PF) 100 MCG/2ML IJ SOLN
INTRAMUSCULAR | Status: AC
  Filled 2021-03-13: qty 2

## 2021-03-13 MED ORDER — ROPIVACAINE HCL 5 MG/ML IJ SOLN: INTRAMUSCULAR | Status: DC | PRN

## 2021-03-13 MED ORDER — PROPOFOL 10 MG/ML IV BOLUS
INTRAVENOUS | Status: DC | PRN
  Administered 2021-03-13: 170 mg via INTRAVENOUS

## 2021-03-13 MED ORDER — CHLORHEXIDINE GLUCONATE 0.12 % MT SOLN
15.0000 mL | Freq: Once | OROMUCOSAL | Status: AC
  Administered 2021-03-13: 15 mL via OROMUCOSAL

## 2021-03-13 MED ORDER — MIDAZOLAM HCL 5 MG/5ML IJ SOLN
INTRAMUSCULAR | Status: DC | PRN
  Administered 2021-03-13 (×2): 1 mg via INTRAVENOUS

## 2021-03-13 MED ORDER — MIDAZOLAM HCL 2 MG/2ML IJ SOLN
1.0000 mg | INTRAMUSCULAR | Status: DC
  Administered 2021-03-13 (×2): 1 mg via INTRAVENOUS
  Filled 2021-03-13: qty 2

## 2021-03-13 MED ORDER — LIDOCAINE HCL 1 % IJ SOLN
INTRAMUSCULAR | Status: DC | PRN
  Administered 2021-03-13: 80 mg via INTRADERMAL

## 2021-03-13 MED ORDER — ONDANSETRON HCL 4 MG/2ML IJ SOLN
INTRAMUSCULAR | Status: DC | PRN
  Administered 2021-03-13: 4 mg via INTRAVENOUS

## 2021-03-13 MED ORDER — LIDOCAINE HCL (PF) 2 % IJ SOLN
INTRAMUSCULAR | Status: AC
  Filled 2021-03-13: qty 5

## 2021-03-13 MED ORDER — MEPERIDINE HCL 50 MG/ML IJ SOLN: 6.2500 mg | INTRAMUSCULAR | Status: DC | PRN

## 2021-03-13 MED ORDER — DEXMEDETOMIDINE (PRECEDEX) IN NS 20 MCG/5ML (4 MCG/ML) IV SYRINGE
PREFILLED_SYRINGE | INTRAVENOUS | Status: AC
  Filled 2021-03-13: qty 5

## 2021-03-13 MED ORDER — ENOXAPARIN SODIUM 40 MG/0.4ML IJ SOSY
40.0000 mg | PREFILLED_SYRINGE | INTRAMUSCULAR | Status: DC
  Administered 2021-03-14: 40 mg via SUBCUTANEOUS
  Filled 2021-03-13: qty 0.4

## 2021-03-13 MED ORDER — FENTANYL CITRATE (PF) 100 MCG/2ML IJ SOLN
INTRAMUSCULAR | Status: DC | PRN
  Administered 2021-03-13 (×4): 50 ug via INTRAVENOUS

## 2021-03-13 MED ORDER — ONDANSETRON HCL 4 MG PO TABS: 4.0000 mg | ORAL_TABLET | Freq: Three times a day (TID) | ORAL | 1 refills | Status: DC | PRN

## 2021-03-13 MED ORDER — ONDANSETRON HCL 4 MG/2ML IJ SOLN: 4.0000 mg | Freq: Four times a day (QID) | INTRAMUSCULAR | Status: DC | PRN

## 2021-03-13 MED ORDER — PROMETHAZINE HCL 25 MG/ML IJ SOLN: 6.2500 mg | INTRAMUSCULAR | Status: DC | PRN

## 2021-03-13 MED ORDER — FENTANYL CITRATE PF 50 MCG/ML IJ SOSY: 25.0000 ug | PREFILLED_SYRINGE | INTRAMUSCULAR | Status: DC | PRN

## 2021-03-13 MED ORDER — MIDAZOLAM HCL 2 MG/2ML IJ SOLN
INTRAMUSCULAR | Status: AC
  Filled 2021-03-13: qty 2

## 2021-03-13 MED ORDER — DEXTROSE-NACL 5-0.9 % IV SOLN: INTRAVENOUS | Status: AC

## 2021-03-13 MED ORDER — PROMETHAZINE HCL 25 MG/ML IJ SOLN
INTRAMUSCULAR | Status: AC
  Administered 2021-03-13: 6.25 mg via INTRAVENOUS
  Filled 2021-03-13: qty 1

## 2021-03-13 MED ORDER — CEFAZOLIN SODIUM-DEXTROSE 1-4 GM/50ML-% IV SOLN
1.0000 g | Freq: Four times a day (QID) | INTRAVENOUS | Status: AC
  Administered 2021-03-13 – 2021-03-14 (×3): 1 g via INTRAVENOUS
  Filled 2021-03-13 (×3): qty 50

## 2021-03-13 MED ORDER — OXYCODONE HCL 5 MG PO TABS: 5.0000 mg | ORAL_TABLET | ORAL | 0 refills | Status: AC | PRN

## 2021-03-13 MED ORDER — ROPIVACAINE HCL 5 MG/ML IJ SOLN
INTRAMUSCULAR | Status: DC | PRN
  Administered 2021-03-13: 18 mL via PERINEURAL
  Administered 2021-03-13: 26 mL via PERINEURAL

## 2021-03-13 MED ORDER — DEXMEDETOMIDINE (PRECEDEX) IN NS 20 MCG/5ML (4 MCG/ML) IV SYRINGE
PREFILLED_SYRINGE | INTRAVENOUS | Status: DC | PRN
  Administered 2021-03-13: 8 ug via INTRAVENOUS
  Administered 2021-03-13: 4 ug via INTRAVENOUS

## 2021-03-13 MED ORDER — FENTANYL CITRATE PF 50 MCG/ML IJ SOSY
50.0000 ug | PREFILLED_SYRINGE | INTRAMUSCULAR | Status: DC
  Administered 2021-03-13 (×2): 50 ug via INTRAVENOUS
  Filled 2021-03-13: qty 2

## 2021-03-13 MED ORDER — LACTATED RINGERS IV SOLN: INTRAVENOUS | Status: DC

## 2021-03-13 MED ORDER — METOCLOPRAMIDE HCL 5 MG/ML IJ SOLN: 5.0000 mg | Freq: Three times a day (TID) | INTRAMUSCULAR | Status: DC | PRN

## 2021-03-13 SURGICAL SUPPLY — 59 items
BAG COUNTER SPONGE SURGICOUNT (BAG) IMPLANT
BAG ZIPLOCK 12X15 (MISCELLANEOUS) ×2 IMPLANT
BIT DRILL 2.0 (BIT) ×1
BIT DRILL 2.5X2.75 QC CALB (BIT) ×1 IMPLANT
BIT DRILL 2.9 CANN QC NONSTRL (BIT) ×1 IMPLANT
BIT DRILL 2XNS DISP SS SM FRAG (BIT) IMPLANT
BIT DRILL CALIBRATED 2.7 (BIT) ×1 IMPLANT
BIT DRL 2XNS DISP SS SM FRAG (BIT) ×1
BNDG COHESIVE 4X5 TAN ST LF (GAUZE/BANDAGES/DRESSINGS) ×2 IMPLANT
BNDG CONFORM 4 STRL LF (GAUZE/BANDAGES/DRESSINGS) ×1 IMPLANT
BNDG ELASTIC 4X5.8 VLCR STR LF (GAUZE/BANDAGES/DRESSINGS) ×2 IMPLANT
BNDG ELASTIC 6X5.8 VLCR STR LF (GAUZE/BANDAGES/DRESSINGS) ×2 IMPLANT
CHLORAPREP W/TINT 26 (MISCELLANEOUS) ×2 IMPLANT
COVER SURGICAL LIGHT HANDLE (MISCELLANEOUS) ×2 IMPLANT
CUFF TOURN SGL QUICK 34 (TOURNIQUET CUFF) ×1
CUFF TRNQT CYL 34X4.125X (TOURNIQUET CUFF) ×1 IMPLANT
DRAPE C-ARM 42X120 X-RAY (DRAPES) ×2 IMPLANT
DRAPE C-ARMOR (DRAPES) ×2 IMPLANT
DRAPE U-SHAPE 47X51 STRL (DRAPES) ×2 IMPLANT
DRSG ADAPTIC 3X8 NADH LF (GAUZE/BANDAGES/DRESSINGS) ×2 IMPLANT
DRSG PAD ABDOMINAL 8X10 ST (GAUZE/BANDAGES/DRESSINGS) ×3 IMPLANT
DRSG TEGADERM 8X12 (GAUZE/BANDAGES/DRESSINGS) ×1 IMPLANT
ELECT REM PT RETURN 15FT ADLT (MISCELLANEOUS) ×2 IMPLANT
GAUZE SPONGE 4X4 12PLY STRL (GAUZE/BANDAGES/DRESSINGS) ×3 IMPLANT
GLOVE SRG 8 PF TXTR STRL LF DI (GLOVE) ×2 IMPLANT
GLOVE SURG ENC MOIS LTX SZ7.5 (GLOVE) ×4 IMPLANT
GLOVE SURG UNDER POLY LF SZ8 (GLOVE) ×2
GOWN STRL REUS W/TWL LRG LVL3 (GOWN DISPOSABLE) ×4 IMPLANT
K-WIRE ACE 1.6X6 (WIRE) ×4
KIT BASIN OR (CUSTOM PROCEDURE TRAY) ×2 IMPLANT
KIT TURNOVER KIT A (KITS) IMPLANT
KWIRE ACE 1.6X6 (WIRE) IMPLANT
MANIFOLD NEPTUNE II (INSTRUMENTS) ×2 IMPLANT
NS IRRIG 1000ML POUR BTL (IV SOLUTION) ×2 IMPLANT
PACK ORTHO EXTREMITY (CUSTOM PROCEDURE TRAY) ×2 IMPLANT
PAD CAST 4YDX4 CTTN HI CHSV (CAST SUPPLIES) ×2 IMPLANT
PADDING CAST ABS 6INX4YD NS (CAST SUPPLIES) ×1
PADDING CAST ABS COTTON 6X4 NS (CAST SUPPLIES) IMPLANT
PADDING CAST COTTON 4X4 STRL (CAST SUPPLIES) ×2
PADDING CAST SYNTHETIC 4 (CAST SUPPLIES) ×1
PADDING CAST SYNTHETIC 4X4 STR (CAST SUPPLIES) IMPLANT
PENCIL SMOKE EVACUATOR (MISCELLANEOUS) IMPLANT
PLATE ACE 100DEG 7HOLE (Plate) ×1 IMPLANT
PROTECTOR NERVE ULNAR (MISCELLANEOUS) ×2 IMPLANT
SCREW ACE CAN 4.0 55M (Screw) ×1 IMPLANT
SCREW ACE CAN 4.0 60M (Screw) ×1 IMPLANT
SCREW CORTICAL 2.7 MM 22MM (Screw) ×1 IMPLANT
SCREW CORTICAL 3.5MM  12MM (Screw) ×3 IMPLANT
SCREW CORTICAL 3.5MM 12MM (Screw) IMPLANT
SCREW NLOCK CANC HEX 4X14 (Screw) ×1 IMPLANT
SCREW NLOCK CANC HEX 4X16 (Screw) ×1 IMPLANT
SUT ETHILON 3 0 PS 1 (SUTURE) ×6 IMPLANT
SUT MNCRL AB 4-0 PS2 18 (SUTURE) IMPLANT
SUT VIC AB 0 CT1 27 (SUTURE) ×1
SUT VIC AB 0 CT1 27XBRD ANTBC (SUTURE) ×1 IMPLANT
SUT VIC AB 1 CT1 36 (SUTURE) ×2 IMPLANT
SUT VIC AB 2-0 CT1 27 (SUTURE) ×1
SUT VIC AB 2-0 CT1 TAPERPNT 27 (SUTURE) ×1 IMPLANT
TOWEL OR 17X26 10 PK STRL BLUE (TOWEL DISPOSABLE) ×2 IMPLANT

## 2021-03-13 NOTE — Progress Notes (Addendum)
Spoke to floor nurse, Gwenlyn Perking, received report for surgery today at 1330.

## 2021-03-13 NOTE — Transfer of Care (Signed)
Immediate Anesthesia Transfer of Care Note  Patient: Robin Alvarez  Procedure(s) Performed: OPEN REDUCTION INTERNAL FIXATION (ORIF) ANKLE FRACTURE (Right: Ankle)  Patient Location: PACU  Anesthesia Type:General  Level of Consciousness: awake, alert , oriented and patient cooperative  Airway & Oxygen Therapy: Patient Spontanous Breathing and Patient connected to face mask oxygen  Post-op Assessment: Report given to RN, Post -op Vital signs reviewed and stable and Patient moving all extremities X 4  Post vital signs: stable  Last Vitals:  Vitals Value Taken Time  BP 133/71 03/13/21 1521  Temp 36.3 C 03/13/21 1520  Pulse 99 03/13/21 1530  Resp 13 03/13/21 1530  SpO2 100 % 03/13/21 1530  Vitals shown include unvalidated device data.  Last Pain:  Vitals:   03/13/21 1525  TempSrc:   PainSc: 0-No pain      Patients Stated Pain Goal: 3 (03/13/21 1249)  Complications: No notable events documented.

## 2021-03-13 NOTE — Op Note (Signed)
Date of Surgery: 03/13/2021  INDICATIONS: [name] is a 40 y.o.-year-old female who sustained a right ankle bimalleolar fracture; she was indicated for open reduction and internal fixation due to the displaced nature of the articular fracture and came to the operating room today for this procedure. The Patient did consent to the procedure after discussion of the risks and benefits.  PREOPERATIVE DIAGNOSIS: right bimalleolar ankle fracture  POSTOPERATIVE DIAGNOSIS: Same.  PROCEDURE: Open treatment of right ankle fracture with internal fixation.  Bimalleolar CPT [phone];  SURGEON: Breeann Reposa P. Aundria Rud, M.D.  ASSIST: Dion Saucier, PA-C  Assistant attestation: PA Mcclung was present for the entire procedure.  He participated in all critical portions..  ANESTHESIA:  general, regional anesthetic  TOURNIQUET TIME: 300 mmHg right thigh for 60 minutes  IV FLUIDS AND URINE: See anesthesia.  ESTIMATED BLOOD LOSS: 10 mL.  IMPLANTS: Biomet small frag set with 4.0 mm cannulated screws medial and one third tubular plate with 3.5 millimeter screws lateral as well as 4.0 mm cancellous screws in the distal aspect of the lateral malleolus.  COMPLICATIONS: None.  DESCRIPTION OF PROCEDURE: The patient was brought to the operating room and placed supine on the operating table.  The patient had been signed prior to the procedure and this was documented. The patient had the anesthesia placed by the anesthesiologist.  A nonsterile tourniquet was placed on the upper thigh.  The prep verification and incision time-outs were performed to confirm that this was the correct patient, site, side and location. The patient had an SCD on the opposite lower extremity. The patient did receive antibiotics prior to the incision and was re-dosed during the procedure as needed at indicated intervals.  The patient had the lower extremity prepped and draped in the standard surgical fashion.  The extremity was exsanguinated using an  esmarch bandage and the tourniquet was inflated to 300 mm Hg.   Incision was made over the distal fibula and the fracture was exposed and reduced anatomically with a clamp. A lag screw was placed into a chevron shaped fragment along the anterior aspect of the fibula at the syndesmotic attachment.  The primary shaft component was not able to be fixed to the distal component with a lag due to a short segment of bony overlap.  This behaves more as a transverse fracture.  However, once we were able to get the syndesmotic piece back to the distal segment we then were able to reduce with a clamp in place our plate.. I then applied a 1/3 tubular locking plate and secured it proximally and distally with non-locking screws. Bone quality was good. I used c-arm to confirm satisfactory reduction and fixation.   I then turned my attention to the medial malleolus. Incision was made over the medial malleolus and the fracture exposed and held provisionally with a clamp. 2 guidepins were placed for the 4.0 mm cannulated screws and then confirmation of reduction was made with fluoroscopy. I then placed 2  68mm screws which had satisfactory fixation.   The syndesmosis was stressed using live fluoroscopy and found to be stable.   The wounds were irrigated, and closed with vicryl with routine closure for the skin. The wounds were injected with local anesthetic. Sterile gauze was applied followed by a posterior splint. She was awakened and returned to the PACU in stable and satisfactory condition. There were no complications.  Of note also, prior to beginning the procedure the ankle was in a persistent state of dislocation and the medial  skin was quite tenuous.  There was some pressure necrosis noted right along the medial malleolar fracture.  POSTOPERATIVE PLAN: [name] will remain nonweightbearing on this leg for approximately 6 weeks; [name] will return for suture removal in 2 weeks.  He will be immobilized in a  short leg splint and then transitioned to a CAM walker at his first follow up appointment.  [name] will receive DVT prophylaxis based on other medications, activity level, and risk ratio of bleeding to thrombosis.  Robin Rued, MD [facility] [phone] 3:22 PM

## 2021-03-13 NOTE — Progress Notes (Signed)
Assisted Dr. Renold Don with right, ultrasound guided, popliteal/saphenous, adductor canal block. Side rails up, monitors on throughout procedure. See vital signs in flow sheet. Tolerated Procedure well.

## 2021-03-13 NOTE — Brief Op Note (Signed)
03/13/2021  3:14 PM  PATIENT:  Robin Alvarez  40 y.o. female  PRE-OPERATIVE DIAGNOSIS:  right ankle bimalleolar fracture  POST-OPERATIVE DIAGNOSIS:  right ankle bimalleolar fracture  PROCEDURE:  Procedure(s): OPEN REDUCTION INTERNAL FIXATION (ORIF) ANKLE FRACTURE (Right)  SURGEON:  Surgeon(s) and Role:    * Yolonda Kida, MD - Primary  PHYSICIAN ASSISTANT: Dion Saucier, PA-C  ANESTHESIA:   regional and general  EBL:  10 mL   BLOOD ADMINISTERED:none  DRAINS: none   LOCAL MEDICATIONS USED:  NONE  SPECIMEN:  No Specimen  DISPOSITION OF SPECIMEN:  N/A  COUNTS:  YES  TOURNIQUET:   Total Tourniquet Time Documented: Thigh (Right) - 60 minutes Total: Thigh (Right) - 60 minutes   DICTATION: .Note written in EPIC  PLAN OF CARE: Admit to inpatient   PATIENT DISPOSITION:  PACU - hemodynamically stable.   Delay start of Pharmacological VTE agent (>24hrs) due to surgical blood loss or risk of bleeding: no

## 2021-03-13 NOTE — Progress Notes (Signed)
PROGRESS NOTE  Robin Alvarez    DOB: [birth year], 40 y.o.  QQV:[id number]  PCP: Neldon Labella, PA   Code Status: Full Code   DOA: 03/10/2021   LOS: 2  Brief Narrative of Current Hospitalization  Robin Alvarez is a 40 y.o. female with a PMH significant for type I DM, chronic alcohol dependence. They presented from home to the ED on 03/10/2021 with right bimalleolar ankle fracture after tripping.  They were treated with pain control.  Patient was admitted to medicine service for further workup and management of chronic conditions as outlined in detail below.  03/13/21 -stable- surgery scheduled today  Assessment & Plan  Principal Problem:   Closed fracture dislocation of right ankle joint Active Problems:   Alcohol dependence (HCC)   Type 1 diabetes mellitus (HCC)   Presence of insulin pump   Leukocytosis  Right bimalleolar ankle fracture -Ortho following, appreciate recommendations  -OR today.  N.p.o. and holding anticoagulation -continue analgesia- oxycodone,gabapentin as well controlled pain at this time  Type I DM-well-controlled -IV hydration with D5 normal saline to avoid hypoglycemia while patient is n.p.o. and so that she will get insulin throughout the day  DVT prophylaxis: SCDs Start: 03/11/21 0101  Diet:  Diet Orders (From admission, onward)     Start     Ordered   03/13/21 0430  Diet NPO time specified  Diet effective ____        03/12/21 1943            Subjective 03/13/21    Pt reports pain is well controlled. She is ready for her surgery.  Disposition Plan & Communication  Patient status: Inpatient  Admitted From: Home Disposition: Home Anticipated discharge date: 10/29  Family Communication: None Consults, Procedures, Significant Events  Consultants:  Ortho  Procedures/significant events:  Fixation 10/28  Objective   Vitals:   03/12/21 0424 03/12/21 1404 03/12/21 2042 03/13/21 0505  BP: 140/70 (!) 160/86 (!) 151/93 (!) 149/85   Pulse: 70 84 95 76  Resp: 16 18 16 16   Temp: 98.4 F (36.9 C) 97.8 F (36.6 C) 98.2 F (36.8 C) 99 F (37.2 C)  TempSrc: Oral  Oral Oral  SpO2: 99% 100% 100% 100%  Weight:      Height:        Intake/Output Summary (Last 24 hours) at 03/13/2021 0940 Last data filed at 03/13/2021 0600 Gross per 24 hour  Intake 1133 ml  Output 3300 ml  Net -2167 ml   Filed Weights   03/10/21 2216  Weight: 70.8 kg    Patient BMI: Body mass index is 28.53 kg/m.   Physical Exam: General: awake, alert, NAD Respiratory: normal respiratory effort. Cardiovascular: quick capillary refill  Nervous: A&O x3. no gross focal neurologic deficits, normal speech Extremities: normal tone, foot has good capillary refill, sensation, range of motion of toes distal to injury. Skin: dry, intact, normal temperature, normal color, No rashes, lesions or ulcers Psychiatry: normal mood and affect  Labs   I have personally reviewed following labs and imaging studies Admission on 03/10/2021  Component Date Value Ref Range Status   WBC 03/11/2021 18.3 (A)  4.0 - 10.5 K/uL Final   RBC 03/11/2021 4.16  3.87 - 5.11 MIL/uL Final   Hemoglobin 03/11/2021 13.6  12.0 - 15.0 g/dL Final   HCT 03/13/2021 40.2  36.0 - 46.0 % Final   MCV 03/11/2021 96.6  80.0 - 100.0 fL Final   MCH 03/11/2021 32.7  26.0 - 34.0 pg Final  MCHC 03/11/2021 33.8  30.0 - 36.0 g/dL Final   RDW 71/21/9758 13.2  11.5 - 15.5 % Final   Platelets 03/11/2021 388  150 - 400 K/uL Final   nRBC 03/11/2021 0.0  0.0 - 0.2 % Final   Neutrophils Relative % 03/11/2021 81  % Final   Neutro Abs 03/11/2021 14.8 (A)  1.7 - 7.7 K/uL Final   Lymphocytes Relative 03/11/2021 15  % Final   Lymphs Abs 03/11/2021 2.7  0.7 - 4.0 K/uL Final   Monocytes Relative 03/11/2021 3  % Final   Monocytes Absolute 03/11/2021 0.6  0.1 - 1.0 K/uL Final   Eosinophils Relative 03/11/2021 0  % Final   Eosinophils Absolute 03/11/2021 0.1  0.0 - 0.5 K/uL Final   Basophils Relative  03/11/2021 0  % Final   Basophils Absolute 03/11/2021 0.1  0.0 - 0.1 K/uL Final   Immature Granulocytes 03/11/2021 1  % Final   Abs Immature Granulocytes 03/11/2021 0.11 (A)  0.00 - 0.07 K/uL Final   SARS Coronavirus 2 by RT PCR 03/11/2021 NEGATIVE  NEGATIVE Final   Influenza A by PCR 03/11/2021 NEGATIVE  NEGATIVE Final   Influenza B by PCR 03/11/2021 NEGATIVE  NEGATIVE Final   Sodium 03/11/2021 141  135 - 145 mmol/L Final   Potassium 03/11/2021 3.5  3.5 - 5.1 mmol/L Final   Chloride 03/11/2021 110  98 - 111 mmol/L Final   CO2 03/11/2021 20 (A)  22 - 32 mmol/L Final   Glucose, Bld 03/11/2021 115 (A)  70 - 99 mg/dL Final   BUN 83/25/4982 8  6 - 20 mg/dL Final   Creatinine, Ser 03/11/2021 0.67  0.44 - 1.00 mg/dL Final   Calcium 64/15/8309 8.8 (A)  8.9 - 10.3 mg/dL Final   Total Protein 40/76/8088 7.3  6.5 - 8.1 g/dL Final   Albumin 03/19/1593 3.9  3.5 - 5.0 g/dL Final   AST 58/59/2924 20  15 - 41 U/L Final   ALT 03/11/2021 18  0 - 44 U/L Final   Alkaline Phosphatase 03/11/2021 53  38 - 126 U/L Final   Total Bilirubin 03/11/2021 0.3  0.3 - 1.2 mg/dL Final   GFR, Estimated 03/11/2021 >60  >60 mL/min Final   Anion gap 03/11/2021 11  5 - 15 Final   Alcohol, Ethyl (B) 03/11/2021 171 (A)  <10 mg/dL Final   Prothrombin Time 03/11/2021 13.2  11.4 - 15.2 seconds Final   INR 03/11/2021 1.0  0.8 - 1.2 Final   Magnesium 03/11/2021 2.0  1.7 - 2.4 mg/dL Final   Magnesium 46/28/6381 1.9  1.7 - 2.4 mg/dL Final   Sodium 77/03/6578 138  135 - 145 mmol/L Final   Potassium 03/11/2021 3.6  3.5 - 5.1 mmol/L Final   Chloride 03/11/2021 106  98 - 111 mmol/L Final   CO2 03/11/2021 20 (A)  22 - 32 mmol/L Final   Glucose, Bld 03/11/2021 80  70 - 99 mg/dL Final   BUN 03/83/3383 7  6 - 20 mg/dL Final   Creatinine, Ser 03/11/2021 0.62  0.44 - 1.00 mg/dL Final   Calcium 29/19/1660 8.6 (A)  8.9 - 10.3 mg/dL Final   Total Protein 60/08/5995 7.3  6.5 - 8.1 g/dL Final   Albumin 74/14/2395 3.9  3.5 - 5.0 g/dL Final    AST 32/06/3341 21  15 - 41 U/L Final   ALT 03/11/2021 18  0 - 44 U/L Final   Alkaline Phosphatase 03/11/2021 58  38 - 126 U/L Final   Total  Bilirubin 03/11/2021 0.3  0.3 - 1.2 mg/dL Final   GFR, Estimated 03/11/2021 >60  >60 mL/min Final   Anion gap 03/11/2021 12  5 - 15 Final   WBC 03/11/2021 16.7 (A)  4.0 - 10.5 K/uL Final   RBC 03/11/2021 4.30  3.87 - 5.11 MIL/uL Final   Hemoglobin 03/11/2021 13.7  12.0 - 15.0 g/dL Final   HCT 61/95/0932 41.2  36.0 - 46.0 % Final   MCV 03/11/2021 95.8  80.0 - 100.0 fL Final   MCH 03/11/2021 31.9  26.0 - 34.0 pg Final   MCHC 03/11/2021 33.3  30.0 - 36.0 g/dL Final   RDW 67/04/4579 13.5  11.5 - 15.5 % Final   Platelets 03/11/2021 412 (A)  150 - 400 K/uL Final   nRBC 03/11/2021 0.0  0.0 - 0.2 % Final   Neutrophils Relative % 03/11/2021 70  % Final   Neutro Abs 03/11/2021 11.8 (A)  1.7 - 7.7 K/uL Final   Lymphocytes Relative 03/11/2021 22  % Final   Lymphs Abs 03/11/2021 3.6  0.7 - 4.0 K/uL Final   Monocytes Relative 03/11/2021 7  % Final   Monocytes Absolute 03/11/2021 1.1 (A)  0.1 - 1.0 K/uL Final   Eosinophils Relative 03/11/2021 1  % Final   Eosinophils Absolute 03/11/2021 0.1  0.0 - 0.5 K/uL Final   Basophils Relative 03/11/2021 0  % Final   Basophils Absolute 03/11/2021 0.1  0.0 - 0.1 K/uL Final   Immature Granulocytes 03/11/2021 0  % Final   Abs Immature Granulocytes 03/11/2021 0.07  0.00 - 0.07 K/uL Final   Phosphorus 03/11/2021 3.5  2.5 - 4.6 mg/dL Final   Hgb D9I MFr Bld 03/11/2021 6.7 (A)  4.8 - 5.6 % Final   Mean Plasma Glucose 03/11/2021 145.59  mg/dL Final   Glucose-Capillary 03/11/2021 112 (A)  70 - 99 mg/dL Final   Glucose-Capillary 03/11/2021 74  70 - 99 mg/dL Final   Glucose-Capillary 03/11/2021 85  70 - 99 mg/dL Final   Glucose-Capillary 03/11/2021 98  70 - 99 mg/dL Final   Glucose-Capillary 03/11/2021 84  70 - 99 mg/dL Final   Glucose-Capillary 03/11/2021 131 (A)  70 - 99 mg/dL Final   Glucose-Capillary 03/11/2021 107 (A)   70 - 99 mg/dL Final   MRSA, PCR 33/82/5053 NEGATIVE  NEGATIVE Final   Staphylococcus aureus 03/12/2021 NEGATIVE  NEGATIVE Final   Glucose-Capillary 03/12/2021 87  70 - 99 mg/dL Final   Glucose-Capillary 03/12/2021 85  70 - 99 mg/dL Final   Glucose-Capillary 03/12/2021 118 (A)  70 - 99 mg/dL Final   Glucose-Capillary 03/12/2021 161 (A)  70 - 99 mg/dL Final   WBC 97/67/3419 9.2  4.0 - 10.5 K/uL Final   RBC 03/13/2021 4.20  3.87 - 5.11 MIL/uL Final   Hemoglobin 03/13/2021 13.3  12.0 - 15.0 g/dL Final   HCT 37/90/2409 41.3  36.0 - 46.0 % Final   MCV 03/13/2021 98.3  80.0 - 100.0 fL Final   MCH 03/13/2021 31.7  26.0 - 34.0 pg Final   MCHC 03/13/2021 32.2  30.0 - 36.0 g/dL Final   RDW 73/53/2992 13.1  11.5 - 15.5 % Final   Platelets 03/13/2021 323  150 - 400 K/uL Final   nRBC 03/13/2021 0.0  0.0 - 0.2 % Final   Glucose-Capillary 03/12/2021 200 (A)  70 - 99 mg/dL Final   Glucose-Capillary 03/12/2021 180 (A)  70 - 99 mg/dL Final   Glucose-Capillary 03/13/2021 220 (A)  70 - 99 mg/dL Final  Glucose-Capillary 03/13/2021 195 (A)  70 - 99 mg/dL Final    Imaging Studies  No results found. Medications   Scheduled Meds:  chlorhexidine  60 mL Topical Once   gabapentin  300 mg Oral TID   HYDROcodone-acetaminophen  1 tablet Oral Q6H   insulin pump   Subcutaneous Q4H   povidone-iodine  2 application Topical Once   No recently discontinued medications to reconcile  LOS: 2 days   Time spent: >44min  Leeroy Bock, DO [facility] 03/13/2021, 9:40 AM   Please refer to amion to contact the [facility] Attending or Consulting provider for this pt  [email or web address] Available by Epic secure chat 7AM-7PM. If 7PM-7AM, please contact night-coverage

## 2021-03-13 NOTE — Progress Notes (Signed)
Inpatient Diabetes Program Recommendations  AACE/ADA: New Consensus Statement on Inpatient Glycemic Control (2015)  Target Ranges:  Prepandial:   less than 140 mg/dL      Peak postprandial:   less than 180 mg/dL (1-2 hours)      Critically ill patients:  140 - 180 mg/dL  Results for Robin Alvarez, Robin Alvarez (MRN [id number]) as of 03/13/2021 10:33  Ref. Range 03/11/2021 23:01 03/12/2021 03:44 03/12/2021 07:20 03/12/2021 11:28 03/12/2021 16:29 03/12/2021 20:39  Glucose-Capillary Latest Ref Range: 70 - 99 mg/dL 107 (H) 87 85 118 (H) 161 (H) 200 (H)  Results for Robin Alvarez, Robin Alvarez (MRN [id number]) as of 03/13/2021 10:33  Ref. Range 03/12/2021 23:45 03/13/2021 03:51 03/13/2021 08:16  Glucose-Capillary Latest Ref Range: 70 - 99 mg/dL 180 (H) 220 (H) 195 (H)    Diabetes history: Type 1 diabetes Outpatient Meds: Insulin pump Current orders: Insulin Pump Q4 hours    Met w/ pt this AM at bedside.  Pt A&O and appropriate to use home insulin pump in hospital.  Pump currently on and running.  Pt verified insulin pump settings as correct (see below).  Pt desires to continue insulin pump during surgery today (thinks she is scheduled to go to surgery around 1pm).  Pt has pump supplies and friend is supposedly bringing pt another sensor today or tomorrow.  Reviewed with pt the need for the RNs to continue to check CBGs with the hospital meter and pt agreeable.  Discussed all of the above with the RN as well.   MD- If surgery will be 2 hours or less, please allow pt to wear wear her insulin pump during surgery (please check CBGs Q1 hours during peri-op period).   If you anticipate surgery will be much longer than 2 hours, may need to use IV Insulin drip for surgery (patient cannot be off her pump for more than 2 hours--she has Type 1 diabetes and can quickly develop DKA without her basal insulin running)   Patient sees Dr. Gabriel Carina for endocrinology.  Last documented insulin pump settings on 08/28/20: MiniMed  Medtronic 670G pump  Basal rates 12 AM 1.0 unit/hour 6 AM 1.1 unit/hour 9 AM 1.05 unit/hr 24-hour total basal insulin 25.05 units  Bolus settings I:C ratio at 12 AM is 10, at 4 PM is 9 Sensitivity at 12 AM is 57 BG target at 12 AM is 115-125, target at 6 AM is 95-105 Insulin on board duration = 4 hrs Dr. Gabriel Carina faxed statement of necessity for change to tandem insulin pump on 09/23/20    --Will follow patient during hospitalization--  Wyn Quaker RN, MSN, CDE Diabetes Coordinator Inpatient Glycemic Control Team Team Pager: [phone] (8a-5p)

## 2021-03-13 NOTE — Anesthesia Procedure Notes (Signed)
Procedure Name: LMA Insertion Date/Time: 03/13/2021 1:47 PM Performed by: Garth Bigness, CRNA Pre-anesthesia Checklist: Patient identified, Emergency Drugs available and Patient being monitored Patient Re-evaluated:Patient Re-evaluated prior to induction Oxygen Delivery Method: Circle system utilized Preoxygenation: Pre-oxygenation with 100% oxygen Induction Type: IV induction Ventilation: Mask ventilation without difficulty LMA: LMA inserted LMA Size: 4.0 Number of attempts: 1 Placement Confirmation: positive ETCO2 Tube secured with: Tape Dental Injury: Teeth and Oropharynx as per pre-operative assessment

## 2021-03-13 NOTE — Plan of Care (Signed)
  Problem: Activity: Goal: Risk for activity intolerance will decrease Outcome: Progressing   Problem: Pain Managment: Goal: General experience of comfort will improve Outcome: Progressing   Problem: Safety: Goal: Ability to remain free from injury will improve Outcome: Progressing   

## 2021-03-13 NOTE — Anesthesia Procedure Notes (Signed)
Anesthesia Regional Block: Adductor canal block   Pre-Anesthetic Checklist: , timeout performed,  Correct Patient, Correct Site, Correct Laterality,  Correct Procedure, Correct Position, site marked,  Risks and benefits discussed,  Surgical consent,  Pre-op evaluation,  At surgeon's request and post-op pain management  Laterality: Lower and Right  Prep: chloraprep       Needles:  Injection technique: Single-shot  Needle Type: Stimiplex     Needle Length: 9cm  Needle Gauge: 21     Additional Needles:   Procedures:,,,, ultrasound used (permanent image in chart),,    Narrative:  Start time: 03/13/2021 12:40 PM End time: 03/13/2021 12:55 PM Injection made incrementally with aspirations every 5 mL.  Performed by: Personally  Anesthesiologist: [name], MD  Additional Notes: BP cuff, EKG monitors applied. Sedation begun. Artery and nerve location verified with ultrasound. Anesthetic injected incrementally (42ml), slowly, and after negative aspirations under direct u/s guidance. Good fascial/perineural spread. Tolerated well.

## 2021-03-13 NOTE — Anesthesia Procedure Notes (Signed)
Anesthesia Regional Block: Popliteal block   Pre-Anesthetic Checklist: , timeout performed,  Correct Patient, Correct Site, Correct Laterality,  Correct Procedure, Correct Position, site marked,  Risks and benefits discussed,  Surgical consent,  Pre-op evaluation,  At surgeon's request and post-op pain management  Laterality: Lower and Right  Prep: chloraprep       Needles:  Injection technique: Single-shot  Needle Type: Stimiplex     Needle Length: 10cm  Needle Gauge: 21     Additional Needles:   Procedures:,,,, ultrasound used (permanent image in chart),,   Motor weakness within 5 minutes.  Narrative:  Start time: 03/13/2021 12:55 PM End time: 03/13/2021 1:05 PM Injection made incrementally with aspirations every 5 mL.  Performed by: Personally  Anesthesiologist: [name], MD  Additional Notes: Nerve located and needle positioned with direct ultrasound guidance. Good perineural spread. Patient tolerated well.

## 2021-03-13 NOTE — H&P (Signed)
H&P update  The surgical history has been reviewed and remains accurate without interval change.  The patient was re-examined and patient's physiologic condition has not changed significantly in the last 30 days. The condition still exists that makes this procedure necessary. The treatment plan remains the same, without new options for care.  No new pharmacological allergies or types of therapy has been initiated that would change the plan or the appropriateness of the plan.  The patient and/or family understand the potential benefits and risks.  Ms. Paster does have an unstable ankle fracture that is currently sitting in a dislocated position.  She is indicated for urgent open reduction internal fixation.  Procedure discussed in detail with the patient.  She is in agreement with that plan.  Amaan Meyer P. Aundria Rud, MD 03/13/2021 1:15 PM

## 2021-03-13 NOTE — Discharge Instructions (Signed)
-  Maintain postoperative splint at all times.  Do not let this become soiled or wet.  -You should elevate your operative extremity with your "toes above nose."  As a often as possible throughout the day.  -For mild to moderate pain you should take Tylenol and Advil in an alternating fashion scheduled around-the-clock.  For breakthrough pain you should take oxycodone as directed.  -Strictly nonweightbearing to the operative extremity at all times.  -For the prevention of blood clot she should take an 81 mg aspirin once per day for 6 weeks.  -Return to see Dr. Lupie Sawa in the office in 2 weeks for routine postop care.  

## 2021-03-13 NOTE — Anesthesia Preprocedure Evaluation (Addendum)
Anesthesia Evaluation  Patient identified by MRN, date of birth, ID band Patient awake    Reviewed: Allergy & Precautions, NPO status , Patient's Chart, lab work & pertinent test results  Airway Mallampati: II  TM Distance: >3 FB Neck ROM: Full    Dental  (+) Dental Advisory Given, Teeth Intact   Pulmonary neg pulmonary ROS, former smoker,    Pulmonary exam normal breath sounds clear to auscultation       Cardiovascular negative cardio ROS Normal cardiovascular exam Rhythm:Regular Rate:Normal     Neuro/Psych PSYCHIATRIC DISORDERS Depression negative neurological ROS     GI/Hepatic negative GI ROS, Neg liver ROS,   Endo/Other  diabetes  Renal/GU negative Renal ROS     Musculoskeletal negative musculoskeletal ROS (+)   Abdominal   Peds  Hematology negative hematology ROS (+)   Anesthesia Other Findings   Reproductive/Obstetrics                            Anesthesia Physical Anesthesia Plan  ASA: 3  Anesthesia Plan: General   Post-op Pain Management: GA combined w/ Regional for post-op pain   Induction: Intravenous  PONV Risk Score and Plan: 3 and Ondansetron, Treatment may vary due to age or medical condition, Midazolam, Propofol infusion and Diphenhydramine  Airway Management Planned: LMA  Additional Equipment:   Intra-op Plan:   Post-operative Plan: Extubation in OR  Informed Consent: I have reviewed the patients History and Physical, chart, labs and discussed the procedure including the risks, benefits and alternatives for the proposed anesthesia with the patient or authorized representative who has indicated his/her understanding and acceptance.     Dental advisory given  Plan Discussed with: CRNA  Anesthesia Plan Comments:        Anesthesia Quick Evaluation

## 2021-03-14 DIAGNOSIS — D72829 Elevated white blood cell count, unspecified: Secondary | ICD-10-CM | POA: Diagnosis not present

## 2021-03-14 DIAGNOSIS — Z9641 Presence of insulin pump (external) (internal): Secondary | ICD-10-CM | POA: Diagnosis not present

## 2021-03-14 DIAGNOSIS — F1020 Alcohol dependence, uncomplicated: Secondary | ICD-10-CM | POA: Diagnosis not present

## 2021-03-14 DIAGNOSIS — S82891A Other fracture of right lower leg, initial encounter for closed fracture: Secondary | ICD-10-CM | POA: Diagnosis not present

## 2021-03-14 LAB — GLUCOSE, CAPILLARY
Glucose-Capillary: 198 mg/dL — ABNORMAL HIGH (ref 70–99)
Glucose-Capillary: 117 mg/dL — ABNORMAL HIGH (ref 70–99)
Glucose-Capillary: 168 mg/dL — ABNORMAL HIGH (ref 70–99)

## 2021-03-14 LAB — CBC
HCT: 39.4 % (ref 36.0–46.0)
Hemoglobin: 12.8 g/dL (ref 12.0–15.0)
MCH: 31.8 pg (ref 26.0–34.0)
MCHC: 32.5 g/dL (ref 30.0–36.0)
MCV: 98 fL (ref 80.0–100.0)
Platelets: 330 10*3/uL (ref 150–400)
RBC: 4.02 MIL/uL (ref 3.87–5.11)
RDW: 12.7 % (ref 11.5–15.5)
WBC: 12.4 10*3/uL — ABNORMAL HIGH (ref 4.0–10.5)
nRBC: 0 % (ref 0.0–0.2)

## 2021-03-14 MED ORDER — ASPIRIN EC 81 MG PO TBEC
81.0000 mg | DELAYED_RELEASE_TABLET | Freq: Every day | ORAL | Status: DC
  Administered 2021-03-14: 81 mg via ORAL
  Filled 2021-03-14: qty 1

## 2021-03-14 MED ORDER — HYDROXYZINE HCL 25 MG PO TABS
50.0000 mg | ORAL_TABLET | Freq: Once | ORAL | Status: AC
  Administered 2021-03-14: 50 mg via ORAL
  Filled 2021-03-14: qty 2

## 2021-03-14 MED ORDER — GABAPENTIN 300 MG PO CAPS: 300.0000 mg | ORAL_CAPSULE | Freq: Three times a day (TID) | ORAL | 0 refills | Status: DC

## 2021-03-14 MED ORDER — ASPIRIN 81 MG PO TBEC: 81.0000 mg | DELAYED_RELEASE_TABLET | Freq: Every day | ORAL | 0 refills | Status: AC

## 2021-03-14 MED ORDER — FLUCONAZOLE 200 MG PO TABS: 200.0000 mg | ORAL_TABLET | Freq: Every day | ORAL | 0 refills | Status: AC

## 2021-03-14 MED ORDER — FLUCONAZOLE 100 MG PO TABS
200.0000 mg | ORAL_TABLET | Freq: Every day | ORAL | Status: DC
  Administered 2021-03-14: 200 mg via ORAL
  Filled 2021-03-14: qty 2

## 2021-03-14 NOTE — Plan of Care (Signed)
Pt to d/c home today. No needs at time of d/c instructions and education.

## 2021-03-14 NOTE — Progress Notes (Signed)
Pt stable at this time. No needs at time of d/c instructions and education. Pt waiting on rolling walker.

## 2021-03-14 NOTE — Evaluation (Signed)
Occupational Therapy Evaluation Patient Details Name: Robin Alvarez MRN: [id number] DOB: 09/25/1980 Today's Date: 03/14/2021   History of Present Illness 40 yo female who sustained a right ankle bimalleolar fracture after fall while walking her dog; s/p ORIF R ankle per Dr Aundria Rud on 03/13/21. PMH: DM   Clinical Impression   Patient evaluated by Occupational Therapy with no further acute OT needs identified. All education has been completed and the patient has no further questions. Patient was MI for ADL tasks with RW while maintaining NWB status. Patient plans to d/c home today. Patient was educated on compensatory strategies and ECT. Patient verbalized understanding. See below for any follow-up Occupational Therapy or equipment needs. OT is signing off. Thank you for this referral.       Recommendations for follow up therapy are one component of a multi-disciplinary discharge planning process, led by the attending physician.  Recommendations may be updated based on patient status, additional functional criteria and insurance authorization.   Follow Up Recommendations  No OT follow up    Assistance Recommended at Discharge Intermittent Supervision/Assistance  Functional Status Assessment     Equipment Recommendations  Other (comment) (reacher and walker tray)    Recommendations for Other Services       Precautions / Restrictions Precautions Precautions: Fall Restrictions Weight Bearing Restrictions: Yes RLE Weight Bearing: Non weight bearing      Mobility Bed Mobility Overal bed mobility: Modified Independent Bed Mobility: Supine to Sit;Sit to Supine     Supine to sit: Modified independent (Device/Increase time) Sit to supine: Modified independent (Device/Increase time)        Transfers Overall transfer level: Needs assistance Equipment used: Rolling walker (2 wheels) Transfers: Sit to/from Stand Sit to Stand: Modified independent (Device/Increase time)            General transfer comment: initial cues for hand placement,  demo's carryover during session      Balance Overall balance assessment: No apparent balance deficits (not formally assessed) Sitting-balance support: Feet supported;No upper extremity supported Sitting balance-Leahy Scale: Good       Standing balance-Leahy Scale: Fair Standing balance comment: briefly able to static stand without support                           ADL either performed or assessed with clinical judgement   ADL Overall ADL's : Modified independent                                       General ADL Comments: patient is at baseline for ADLs with RW at this time. patient was educated on ECT and compensatory strategies for ADLs at home. patient was educated on walker tray and carrying items from one area to another. patient verbalized understanding. patient to look into getting walker tray and reacher at home. patient was able to complete toileting, LB dressing, and tranfers with MI at this time. patient is motivated to go home and have friends to support her in next level of care while healing.     Vision Baseline Vision/History: 1 Wears glasses Patient Visual Report: No change from baseline       Perception     Praxis      Pertinent Vitals/Pain Pain Assessment: Faces Pain Score: 2  Faces Pain Scale: Hurts a little bit Pain Location: L ankle Pain Descriptors / Indicators: Aching;Grimacing;Sore Pain  Intervention(s): Limited activity within patient's tolerance;Monitored during session;Premedicated before session     Hand Dominance Right   Extremity/Trunk Assessment Upper Extremity Assessment Upper Extremity Assessment: Overall WFL for tasks assessed   Lower Extremity Assessment Lower Extremity Assessment: Defer to PT evaluation RLE Deficits / Details: knee and hip grossly WFL RLE: Unable to fully assess due to immobilization   Cervical / Trunk  Assessment Cervical / Trunk Assessment: Normal   Communication Communication Communication: No difficulties   Cognition Arousal/Alertness: Awake/alert Behavior During Therapy: WFL for tasks assessed/performed Overall Cognitive Status: Within Functional Limits for tasks assessed                                       General Comments       Exercises     Shoulder Instructions      Home Living Family/patient expects to be discharged to:: Private residence Living Arrangements: Non-relatives/Friends;Alone Available Help at Discharge: Friend(s);Available PRN/intermittently Type of Home: Apartment Home Access: Stairs to enter Entrance Stairs-Number of Steps: 6 or level entrance  but longer distance   Home Layout: One level     Bathroom Shower/Tub: Tub/shower unit         Home Equipment: None          Prior Functioning/Environment Prior Level of Function : Independent/Modified Independent;Working/employed             Mobility Comments: server ADLs Comments: independent in ADLs, 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 Problem List: Decreased strength;Decreased activity tolerance;Decreased knowledge of use of DME or AE;Decreased knowledge of precautions;Impaired balance (sitting and/or standing);Decreased safety awareness      OT Treatment/Interventions: Self-care/ADL training;DME and/or AE instruction;Energy conservation;Therapeutic activities;Balance training;Patient/family education;Therapeutic exercise;Neuromuscular education    OT Goals(Current goals can be found in the care plan section) Acute Rehab OT Goals OT Goal Formulation: All assessment and education complete, DC therapy  OT Frequency: Min 2X/week   Barriers to D/C:            Co-evaluation              AM-PAC OT "6 Clicks" Daily Activity     Outcome Measure Help from another person eating meals?: None Help from another person taking care of personal grooming?: None Help  from another person toileting, which includes using toliet, bedpan, or urinal?: None Help from another person bathing (including washing, rinsing, drying)?: None Help from another person to put on and taking off regular upper body clothing?: None Help from another person to put on and taking off regular lower body clothing?: None 6 Click Score: 24   End of Session Equipment Utilized During Treatment: Gait belt;Rolling walker (2 wheels) Nurse Communication: Other (comment)  Activity Tolerance: Patient tolerated treatment well Patient left: in bed;with call bell/phone within reach  OT Visit Diagnosis: Unsteadiness on feet (R26.81);History of falling (Z91.81)                Time: 1610-9604 OT Time Calculation (min): 33 min Charges:  OT General Charges $OT Visit: 1 Visit OT Evaluation $OT Eval Low Complexity: 1 Low OT Treatments $Self Care/Home Management : 8-22 mins  Sharyn Blitz OTR/L, MS Acute Rehabilitation Department Office# [phone] Pager# [phone]   Chalmers Guest Shamiracle Gorden 03/14/2021, 12:51 PM

## 2021-03-14 NOTE — TOC Transition Note (Signed)
Transition of Care [facility]) - CM/SW Discharge Note  Patient Details  Name: Robin Alvarez MRN: [id number] Date of Birth: 1980-10-03  [facility] CM/SW Contact:  Ewing Schlein, LCSW Phone Number: 03/14/2021, 1:27 PM  Clinical Narrative: Patient will need rolling walker to discharge home. CSW made DME referral to [facility] with [employer]. [employer] to deliver walker to patient's room. TOC signing off.  Final next level of care: Home/Self Care Barriers to Discharge: Barriers Resolved  Patient Goals and CMS Choice Choice offered to / list presented to : NA  Discharge Plan and Services       DME Arranged: Walker rolling DME Agency: [employer] Date DME Agency Contacted: 03/14/21 Time DME Agency Contacted: 1211 Representative spoke with at DME Agency: Jasmine  Readmission Risk Interventions No flowsheet data found.

## 2021-03-14 NOTE — Progress Notes (Signed)
   Subjective:  Patient reports pain as mild.  No complaints overnight.  No chest pain, shortness of breath, or fevers or chills.  She describes some persistent numbness in the foot related to her nerve block.  Objective:   VITALS:   Vitals:   03/13/21 1928 03/13/21 2129 03/14/21 0142 03/14/21 0543  BP: 132/84 109/68 116/67 125/70  Pulse: (!) 109 90 75 76  Resp: 16 16 18 17   Temp:  98.3 F (36.8 C) 98.3 F (36.8 C) (!) 97.5 F (36.4 C)  TempSrc:  Oral Oral Oral  SpO2: 98% 97% 98% 100%  Weight:      Height:        Dorsiflexion/Plantar flexion intact Incision: dressing C/D/I and splint clean and dry Compartment soft -Endorses no sensation intact at this juncture throughout the foot.  Is able to activate EHL and FHL Capillary refill less than 2 seconds  Lab Results  Component Value Date   WBC 12.4 (H) 03/14/2021   HGB 12.8 03/14/2021   HCT 39.4 03/14/2021   MCV 98.0 03/14/2021   PLT 330 03/14/2021   BMET    Component Value Date/Time   NA 138 03/11/2021 0509   K 3.6 03/11/2021 0509   CL 106 03/11/2021 0509   CO2 20 (L) 03/11/2021 0509   GLUCOSE 80 03/11/2021 0509   BUN 7 03/11/2021 0509   CREATININE 0.64 03/13/2021 1201   CALCIUM 8.6 (L) 03/11/2021 0509   GFRNONAA >60 03/13/2021 1201     Assessment/Plan: 1 Day Post-Op   Principal Problem:   Closed fracture dislocation of right ankle joint Active Problems:   Alcohol dependence (HCC)   Type 1 diabetes mellitus (HCC)   Presence of insulin pump   Leukocytosis   Up with therapy -Nonweightbearing right lower extremity.  -She can discharge home when she has been cleared by therapy.  She is motivated to discharge today.  -She will need to be strict LE elevated while she is not up with therapy.  I will see her back in the office in 2 weeks.  -Okay to DC home on daily aspirin x6 weeks for DVT prophylaxis.   03/15/2021 03/14/2021, 9:21 AM   03/16/2021, MD [phone]

## 2021-03-14 NOTE — Evaluation (Signed)
Physical Therapy Evaluation Patient Details Name: Robin Alvarez MRN: [id number] DOB: [birth year] Today's Date: 03/14/2021  History of Present Illness  40 yo female who sustained a right ankle bimalleolar fracture after fall while walking her dog; s/p ORIF R ankle per Dr Stann Mainland on 03/13/21. PMH: DM  Clinical Impression  Patient evaluated by Physical Therapy with no further acute PT needs identified. All education has been completed and the patient has no further questions.  Pt amb hallway distance with RW. Discussed w/c, crutches and knee scooter. Pt feels she will best be able to manage with RW at this time, has only short distances to amb in her apt other than entrance. PT in agreement.  She may inquire about knee scooter at her f/u visit with Dr Stann Mainland.   See below for any follow-up Physical Therapy or equipment needs. PT is signing off. Thank you for this referral.        Recommendations for follow up therapy are one component of a multi-disciplinary discharge planning process, led by the attending physician.  Recommendations may be updated based on patient status, additional functional criteria and insurance authorization.  Follow Up Recommendations Other (comment) (per MD at f/u, likely OPPT)    Assistance Recommended at Discharge PRN  Functional Status Assessment    Equipment Recommendations  Rolling walker (2 wheels)    Recommendations for Other Services       Precautions / Restrictions Precautions Precautions: Fall Restrictions Weight Bearing Restrictions: Yes RLE Weight Bearing: Non weight bearing      Mobility  Bed Mobility Overal bed mobility: Modified Independent Bed Mobility: Supine to Sit;Sit to Supine     Supine to sit: Modified independent (Device/Increase time) Sit to supine: Modified independent (Device/Increase time)        Transfers Overall transfer level: Needs assistance Equipment used: Rolling walker (2 wheels) Transfers: Sit to/from  Stand Sit to Stand: Supervision;Modified independent (Device/Increase time)           General transfer comment: initial cues for hand placement,  demo's carryover during session    Ambulation/Gait Ambulation/Gait assistance: Supervision Gait Distance (Feet): 65 Feet Assistive device: Rolling walker (2 wheels)       General Gait Details: cues for NWB RLE , positioning RLE  Stairs            Wheelchair Mobility    Modified Rankin (Stroke Patients Only)       Balance Overall balance assessment: Needs assistance Sitting-balance support: Feet supported;No upper extremity supported Sitting balance-Leahy Scale: Good       Standing balance-Leahy Scale: Fair Standing balance comment: briefly able to static stand without support                             Pertinent Vitals/Pain Pain Assessment: 0-10 Pain Score: 2  Pain Location: L ankle Pain Descriptors / Indicators: Aching;Grimacing;Sore Pain Intervention(s): Premedicated before session;Monitored during session;Limited activity within patient's tolerance    Home Living Family/patient expects to be discharged to:: Private residence Living Arrangements: Non-relatives/Friends;Alone   Type of Home: Apartment Home Access: Stairs to enter   Entrance Stairs-Number of Steps: 6 or level entrance  but longer distance   Home Layout: One level Home Equipment: None      Prior Function Prior Level of Function : Independent/Modified Independent;Working/employed             Mobility Comments: Sports coach  Extremity/Trunk Assessment   Upper Extremity Assessment Upper Extremity Assessment: Overall WFL for tasks assessed    Lower Extremity Assessment Lower Extremity Assessment: RLE deficits/detail RLE Deficits / Details: knee and hip grossly WFL RLE: Unable to fully assess due to immobilization       Communication   Communication: No difficulties  Cognition  Arousal/Alertness: Awake/alert Behavior During Therapy: WFL for tasks assessed/performed Overall Cognitive Status: Within Functional Limits for tasks assessed                                          General Comments      Exercises     Assessment/Plan    PT Assessment All further PT needs can be met in the next venue of care  PT Problem List         PT Treatment Interventions      PT Goals (Current goals can be found in the Care Plan section)  Acute Rehab PT Goals PT Goal Formulation: All assessment and education complete, DC therapy    Frequency     Barriers to discharge        Co-evaluation               AM-PAC PT "6 Clicks" Mobility  Outcome Measure Help needed turning from your back to your side while in a flat bed without using bedrails?: None Help needed moving from lying on your back to sitting on the side of a flat bed without using bedrails?: None Help needed moving to and from a bed to a chair (including a wheelchair)?: None Help needed standing up from a chair using your arms (e.g., wheelchair or bedside chair)?: None Help needed to walk in hospital room?: None Help needed climbing 3-5 steps with a railing? : A Little 6 Click Score: 23    End of Session Equipment Utilized During Treatment: Gait belt Activity Tolerance: Patient tolerated treatment well Patient left: with call bell/phone within reach;in bed;with bed alarm set   PT Visit Diagnosis: Other abnormalities of gait and mobility (R26.89)    Time: 0757-3225 PT Time Calculation (min) (ACUTE ONLY): 23 min   Charges:   PT Evaluation $PT Eval Low Complexity: 1 Low PT Treatments $Gait Training: 8-22 mins        Baxter Flattery, PT  Acute Rehab Dept ([place]) [phone] Pager [phone]  03/14/2021   [facility] 03/14/2021, 12:02 PM

## 2021-03-14 NOTE — Discharge Summary (Addendum)
Physician Discharge Summary  NEOSHA SWITALSKI YQM:[id number] DOB: [birth year] DOA: 03/10/2021  PCP: Neldon Labella, PA  Admit date: 03/10/2021 Discharge date: 03/14/2021  Admitted From: Home  Disposition:  Home   Recommendations for Outpatient Follow-up and new medication changes:  Follow up with Stacy Wingate PA in 7 to 10 days.  Follow up with Orthopedics as outpatient as scheduled.  Strict lower extremity elevation while seating or in bed.  DVT prophylaxis with aspirin for 6 weeks per orthopedic recommendations.  Fluconazole for vaginal yeast prophylaxis.   Home Health: na   Equipment/Devices: na    Discharge Condition: stable  CODE STATUS: full  Diet recommendation:  regular.   Brief/Interim Summary: Mrs. Mcelrath was admitted to the hospital with the working diagnosis of right bimalleolar ankle fracture.    40 yo female with the past medical history of  T1DM, and chronic alcohol dependence who presented with right ankle pain. Patient sustained a mechanical fall from her own hight while walking with her dog, she slipped and fell, landing on her right ankle, developing immediate pain and not able to ambulate. On her initial physical examination her blood pressure 118/63, HR 75, RR 14 and  oxygen saturation 98% on room air. Her lungs were clear to auscultation, heart S1 and S2 present, abdomen soft and non tender, right ankle had dressing in place.    Sodium 141, potassium 3.5, chloride 110, bicarb 20, glucose 115, BUN 8, creatinine 0.67, white count 18.3, hemoglobin 13.6, hematocrit 40.2, platelets 388 SARS COVID-19 negative.  Alcohol level 171.  Right ankle x rays with acute fracture of the right lateral malleolus with posterior discolcation of the right ankle.   Right ankle CT with bimalleolar fracture with disruption of the ankle mortise and approximately 15 mm lateral subluxation of the talus in relation to the tibial plafond.   Chest film with no infiltrates.   EKG 95  bmp, no normal axis and normal intervals, sinus rhythm, with no significant ST segment and T wave changes.   Patient was placed on analgesics and supportive medical therapy. Orthopedics was consulted underwent, open reduction internal fixation om 10/28.  Pain has improved and patient will follow up with orthopedics as outpatient.    Right bimalleolar ankle fracture.  Patient underwent procedure with no major complications.  Her pain is better controlled. She will follow-up as an outpatient with orthopedics. Currently nonweightbearing.  2.  Type 1 diabetes mellitus.  Patient received insulin therapy with glucose control. Insulin pump.   3.  Reactive leukocytosis.  No signs of systemic infection.   4.  Alcohol dependence.  Her alcohol level was elevated on admission, no signs of alcohol withdrawal during her hospitalization. Follow-up as an outpatient.  Discharge Diagnoses:  Principal Problem:   Closed fracture dislocation of right ankle joint Active Problems:   Alcohol dependence (HCC)   Type 1 diabetes mellitus (HCC)   Presence of insulin pump   Leukocytosis    Discharge Instructions   Allergies as of 03/14/2021       Reactions   Bactrim [sulfamethoxazole-trimethoprim] Hives        Medication List     TAKE these medications    aspirin 81 MG EC tablet Take 1 tablet (81 mg total) by mouth daily. Swallow whole.   fluconazole 200 MG tablet Commonly known as: DIFLUCAN Take 1 tablet (200 mg total) by mouth daily for 5 days.   gabapentin 300 MG capsule Commonly known as: NEURONTIN Take 1 capsule (300 mg total) by mouth  3 (three) times daily for 15 days.   insulin aspart 100 UNIT/ML injection Commonly known as: novoLOG Inject 0-110 Units into the skin as directed. Use up to 110 units via insulin pump   norethindrone-ethinyl estradiol 1/35 tablet Commonly known as: ORTHO-NOVUM Take 1 tablet by mouth daily.   ondansetron 4 MG tablet Commonly known as:  Zofran Take 1 tablet (4 mg total) by mouth every 8 (eight) hours as needed for nausea or vomiting.   oxyCODONE 5 MG immediate release tablet Commonly known as: Roxicodone Take 1 tablet (5 mg total) by mouth every 4 (four) hours as needed for severe pain or breakthrough pain.   polyvinyl alcohol 1.4 % ophthalmic solution Commonly known as: LIQUIFILM TEARS Place 1 drop into both eyes as needed for dry eyes.        Follow-up Information     Yolonda Kida, MD Follow up in 2 week(s).   Specialty: Orthopedic Surgery Why: For suture removal, For wound re-check Contact information: [address] [place] [address] [phone]                Allergies  Allergen Reactions   Bactrim [Sulfamethoxazole-Trimethoprim] Hives    Consultations: Orthopedics   Procedures/Studies: DG Ankle 2 Views Right  Result Date: 03/10/2021 CLINICAL DATA:  Status post reduction. EXAM: RIGHT ANKLE - 2 VIEW COMPARISON:  March 10, 2021 FINDINGS: The right ankle was imaged in a fiberglass cast with subsequently obscured osseous and soft tissue detail. Acute fracture deformities of the right lateral malleolus and right medial malleolus are seen. An additional fracture of the right posterior malleolus cannot be excluded. Disruption of the right ankle mortise is noted with lateral dislocation of the right ankle. Diffuse soft tissue swelling is present. IMPRESSION: Acute fracture deformities of the right lateral malleolus and right medial malleolus with disruption of the right ankle mortise and lateral dislocation of the right ankle. Electronically Signed   By: Aram Candela M.D.   On: 03/10/2021 23:08   DG Ankle 2 Views Right  Result Date: 03/10/2021 CLINICAL DATA:  Status post fall. EXAM: RIGHT ANKLE - 2 VIEW COMPARISON:  None. FINDINGS: An acute, displaced fracture deformity is seen involving the right lateral malleolus. An additional fracture of the right lateral  malleolus is suspected. There is posterior dislocation of the right ankle. Diffuse soft tissue swelling is seen. IMPRESSION: Acute fracture of the right lateral malleolus with posterior dislocation of the right ankle. Electronically Signed   By: Aram Candela M.D.   On: 03/10/2021 22:27   DG Ankle Complete Right  Result Date: 03/13/2021 CLINICAL DATA:  Ankle surgery EXAM: RIGHT ANKLE - COMPLETE 3+ VIEW COMPARISON:  CT 03/10/2021 FINDINGS: Three low resolution intraoperative spot views of the right ankle. Total fluoroscopy time was 11 seconds. The images demonstrate surgical plate and screw fixation of the distal fibula for distal fibular fracture. Screw fixation of medial malleolar fracture. Reduction of previously noted ankle dislocation. Anatomic alignment on the images submitted. Additional partially visualized fixating wire at the first metatarsal IMPRESSION: Intraoperative fluoroscopic assistance provided during surgical fixation of ankle fractures Electronically Signed   By: Jasmine Pang M.D.   On: 03/13/2021 15:34   CT Ankle Right Wo Contrast  Result Date: 03/10/2021 CLINICAL DATA:  Ankle fracture. EXAM: CT OF THE RIGHT ANKLE WITHOUT CONTRAST TECHNIQUE: Multidetector CT imaging of the right ankle was performed according to the standard protocol. Multiplanar CT image reconstructions were also generated. COMPARISON:  Earlier radiograph dated 03/10/2021. FINDINGS:  Bones/Joint/Cartilage There is a displaced fracture of the medial malleolus with proximally 7 mm lateral displacement of the distal fracture fragment. There is a comminuted and displaced fracture of the lateral malleolus. There is 13 mm lateral displacement and 11 mm posterior displacement of the major distal fracture fragment. No other acute fracture. There is destruction of the ankle mortise and approximately 15 mm lateral subluxation of the talus in relation to the tibial plafond. Ligaments Suboptimally assessed by CT. Muscles and  Tendons No intramuscular hematoma. Soft tissues Subcutaneous soft tissue edema.  No fluid collection. IMPRESSION: Bimalleolar fracture with disruption of the ankle mortise and approximately 15 mm lateral subluxation of the talus in relation to the tibial plafond. Electronically Signed   By: Elgie Collard M.D.   On: 03/10/2021 23:55   DG Chest Port 1 View  Result Date: 03/10/2021 CLINICAL DATA:  Preoperative evaluation. EXAM: PORTABLE CHEST 1 VIEW COMPARISON:  September 11, 2020 FINDINGS: The heart size and mediastinal contours are within normal limits. Both lungs are clear. The visualized skeletal structures are unremarkable. IMPRESSION: No active disease. Electronically Signed   By: Aram Candela M.D.   On: 03/10/2021 23:19   DG C-Arm 1-60 Min-No Report  Result Date: 03/13/2021 Fluoroscopy was utilized by the requesting physician.  No radiographic interpretation.     Procedures: right ankle fracture external reduction and internal fixation   Subjective: Patient with no nausea or vomiting, her right ankle pain is well controlled.   Discharge Exam: Vitals:   03/14/21 0142 03/14/21 0543  BP: 116/67 125/70  Pulse: 75 76  Resp: 18 17  Temp: 98.3 F (36.8 C) (!) 97.5 F (36.4 C)  SpO2: 98% 100%   Vitals:   03/13/21 1928 03/13/21 2129 03/14/21 0142 03/14/21 0543  BP: 132/84 109/68 116/67 125/70  Pulse: (!) 109 90 75 76  Resp: Temp:  98.3 F (36.8 C) 98.3 F (36.8 C) (!) 97.5 F (36.4 C)  TempSrc:  Oral Oral Oral  SpO2: 98% 97% 98% 100%  Weight:      Height:        General: Not in pain or dyspnea  Neurology: Awake and alert, non focal  E ENT: no pallor, no icterus, oral mucosa moist Cardiovascular: No JVD. S1-S2 present, rhythmic, no gallops, rubs, or murmurs. No lower extremity edema. Pulmonary: vesicular breath sounds bilaterally, adequate air movement, no wheezing, rhonchi or rales. Gastrointestinal. Abdomen flat, no organomegaly, non tender, no rebound or  guarding Skin. No rashes Musculoskeletal: right ankle with dressing in place, mild local edema at the toes.   The results of significant diagnostics from this hospitalization (including imaging, microbiology, ancillary and laboratory) are listed below for reference.     Microbiology: Recent Results (from the past 240 hour(s))  Resp Panel by RT-PCR (Flu A&B, Covid) Nasopharyngeal Swab     Status: None   Collection Time: 03/11/21  1:09 AM   Specimen: Nasopharyngeal Swab; Nasopharyngeal(NP) swabs in vial transport medium  Result Value Ref Range Status   SARS Coronavirus 2 by RT PCR NEGATIVE NEGATIVE Final    Comment: (NOTE) SARS-CoV-2 target nucleic acids are NOT DETECTED.  The SARS-CoV-2 RNA is generally detectable in upper respiratory specimens during the acute phase of infection. The lowest concentration of SARS-CoV-2 viral copies this assay can detect is 138 copies/mL. A negative result does not preclude SARS-Cov-2 infection and should not be used as the sole basis for treatment or other patient management decisions. A negative result may occur  with  improper specimen collection/handling, submission of specimen other than nasopharyngeal swab, presence of viral mutation(s) within the areas targeted by this assay, and inadequate number of viral copies(<138 copies/mL). A negative result must be combined with clinical observations, patient history, and epidemiological information. The expected result is Negative.  Fact Sheet for Patients:  BloggerCourse.com  Fact Sheet for Healthcare Providers:  SeriousBroker.it  This test is no t yet approved or cleared by the [place] FDA and  has been authorized for detection and/or diagnosis of SARS-CoV-2 by FDA under an Emergency Use Authorization (EUA). This EUA will remain  in effect (meaning this test can be used) for the duration of the COVID-19 declaration under Section 564(b)(1) of  the Act, 21 U.S.C.section 360bbb-3(b)(1), unless the authorization is terminated  or revoked sooner.       Influenza A by PCR NEGATIVE NEGATIVE Final   Influenza B by PCR NEGATIVE NEGATIVE Final    Comment: (NOTE) The Xpert Xpress SARS-CoV-2/FLU/RSV plus assay is intended as an aid in the diagnosis of influenza from Nasopharyngeal swab specimens and should not be used as a sole basis for treatment. Nasal washings and aspirates are unacceptable for Xpert Xpress SARS-CoV-2/FLU/RSV testing.  Fact Sheet for Patients: BloggerCourse.com  Fact Sheet for Healthcare Providers: SeriousBroker.it  This test is not yet approved or cleared by the [place] FDA and has been authorized for detection and/or diagnosis of SARS-CoV-2 by FDA under an Emergency Use Authorization (EUA). This EUA will remain in effect (meaning this test can be used) for the duration of the COVID-19 declaration under Section 564(b)(1) of the Act, 21 U.S.C. section 360bbb-3(b)(1), unless the authorization is terminated or revoked.  Performed at [facility], [address] [place], [place] [address]   Surgical pcr screen     Status: None   Collection Time: 03/12/21  3:42 AM   Specimen: Nasal Mucosa; Nasal Swab  Result Value Ref Range Status   MRSA, PCR NEGATIVE NEGATIVE Final   Staphylococcus aureus NEGATIVE NEGATIVE Final    Comment: (NOTE) The Xpert SA Assay (FDA approved for NASAL specimens in patients 31 years of age and older), is one component of a comprehensive surveillance program. It is not intended to diagnose infection nor to guide or monitor treatment. Performed at [facility], [address], [place], [place] [address]      Labs: BNP (last 3 results) No results for input(s): BNP in the last 8760 hours. Basic Metabolic Panel: Recent Labs  Lab 03/11/21 0002 03/11/21 0509 03/13/21 1201  NA 141 138  --    K 3.5 3.6  --   CL 110 106  --   CO2 20* 20*  --   GLUCOSE 115* 80  --   BUN 8 7  --   CREATININE 0.67 0.62 0.64  CALCIUM 8.8* 8.6*  --   MG 1.9 2.0  --   PHOS  --  3.5  --    Liver Function Tests: Recent Labs  Lab 03/11/21 0002 03/11/21 0509  AST 20 21  ALT 18 18  ALKPHOS 53 58  BILITOT 0.3 0.3  PROT 7.3 7.3  ALBUMIN 3.9 3.9   No results for input(s): LIPASE, AMYLASE in the last 168 hours. No results for input(s): AMMONIA in the last 168 hours. CBC: Recent Labs  Lab 03/11/21 0002 03/11/21 0509 03/13/21 0315 03/14/21 0324  WBC 18.3* 16.7* 9.2 12.4*  NEUTROABS 14.8* 11.8*  --   --   HGB 13.6 13.7  13.3 12.8  HCT 40.2 41.2 41.3 39.4  MCV 96.6 95.8 98.3 98.0  PLT 388 412* 323 330   Cardiac Enzymes: No results for input(s): CKTOTAL, CKMB, CKMBINDEX, TROPONINI in the last 168 hours. BNP: Invalid input(s): POCBNP CBG: Recent Labs  Lab 03/13/21 1530 03/13/21 2019 03/13/21 2233 03/14/21 0323 03/14/21 0815  GLUCAP 178* 307* 361* 198* 117*   D-Dimer No results for input(s): DDIMER in the last 72 hours. Hgb A1c No results for input(s): HGBA1C in the last 72 hours. Lipid Profile No results for input(s): CHOL, HDL, LDLCALC, TRIG, CHOLHDL, LDLDIRECT in the last 72 hours. Thyroid function studies No results for input(s): TSH, T4TOTAL, T3FREE, THYROIDAB in the last 72 hours.  Invalid input(s): FREET3 Anemia work up No results for input(s): VITAMINB12, FOLATE, FERRITIN, TIBC, IRON, RETICCTPCT in the last 72 hours. Urinalysis    Component Value Date/Time   COLORURINE YELLOW 09/11/2020 1314   APPEARANCEUR CLEAR 09/11/2020 1314   LABSPEC 1.022 09/11/2020 1314   PHURINE 5.0 09/11/2020 1314   GLUCOSEU >=500 (A) 09/11/2020 1314   HGBUR MODERATE (A) 09/11/2020 1314   BILIRUBINUR NEGATIVE 09/11/2020 1314   KETONESUR 80 (A) 09/11/2020 1314   PROTEINUR NEGATIVE 09/11/2020 1314   NITRITE NEGATIVE 09/11/2020 1314   LEUKOCYTESUR NEGATIVE 09/11/2020 1314   Sepsis  Labs Invalid input(s): PROCALCITONIN,  WBC,  LACTICIDVEN Microbiology Recent Results (from the past 240 hour(s))  Resp Panel by RT-PCR (Flu A&B, Covid) Nasopharyngeal Swab     Status: None   Collection Time: 03/11/21  1:09 AM   Specimen: Nasopharyngeal Swab; Nasopharyngeal(NP) swabs in vial transport medium  Result Value Ref Range Status   SARS Coronavirus 2 by RT PCR NEGATIVE NEGATIVE Final    Comment: (NOTE) SARS-CoV-2 target nucleic acids are NOT DETECTED.  The SARS-CoV-2 RNA is generally detectable in upper respiratory specimens during the acute phase of infection. The lowest concentration of SARS-CoV-2 viral copies this assay can detect is 138 copies/mL. A negative result does not preclude SARS-Cov-2 infection and should not be used as the sole basis for treatment or other patient management decisions. A negative result may occur with  improper specimen collection/handling, submission of specimen other than nasopharyngeal swab, presence of viral mutation(s) within the areas targeted by this assay, and inadequate number of viral copies(<138 copies/mL). A negative result must be combined with clinical observations, patient history, and epidemiological information. The expected result is Negative.  Fact Sheet for Patients:  BloggerCourse.com  Fact Sheet for Healthcare Providers:  SeriousBroker.it  This test is no t yet approved or cleared by the [place] FDA and  has been authorized for detection and/or diagnosis of SARS-CoV-2 by FDA under an Emergency Use Authorization (EUA). This EUA will remain  in effect (meaning this test can be used) for the duration of the COVID-19 declaration under Section 564(b)(1) of the Act, 21 U.S.C.section 360bbb-3(b)(1), unless the authorization is terminated  or revoked sooner.       Influenza A by PCR NEGATIVE NEGATIVE Final   Influenza B by PCR NEGATIVE NEGATIVE Final    Comment:  (NOTE) The Xpert Xpress SARS-CoV-2/FLU/RSV plus assay is intended as an aid in the diagnosis of influenza from Nasopharyngeal swab specimens and should not be used as a sole basis for treatment. Nasal washings and aspirates are unacceptable for Xpert Xpress SARS-CoV-2/FLU/RSV testing.  Fact Sheet for Patients: BloggerCourse.com  Fact Sheet for Healthcare Providers: SeriousBroker.it  This test is not yet approved or cleared by the [place] and has been authorized  for detection and/or diagnosis of SARS-CoV-2 by FDA under an Emergency Use Authorization (EUA). This EUA will remain in effect (meaning this test can be used) for the duration of the COVID-19 declaration under Section 564(b)(1) of the Act, 21 U.S.C. section 360bbb-3(b)(1), unless the authorization is terminated or revoked.  Performed at [facility], [address], [place], [place] [address]   Surgical pcr screen     Status: None   Collection Time: 03/12/21  3:42 AM   Specimen: Nasal Mucosa; Nasal Swab  Result Value Ref Range Status   MRSA, PCR NEGATIVE NEGATIVE Final   Staphylococcus aureus NEGATIVE NEGATIVE Final    Comment: (NOTE) The Xpert SA Assay (FDA approved for NASAL specimens in patients 80 years of age and older), is one component of a comprehensive surveillance program. It is not intended to diagnose infection nor to guide or monitor treatment. Performed at [facility], [address] [place], [place] [address]      Time coordinating discharge: 45 minutes  SIGNED:   Coralie Keens, MD  [facility] 03/14/2021, 10:28 AM

## 2021-03-14 NOTE — Plan of Care (Signed)
  Problem: Clinical Measurements: Goal: Respiratory complications will improve Outcome: Progressing   Problem: Clinical Measurements: Goal: Cardiovascular complication will be avoided Outcome: Progressing   Problem: Pain Managment: Goal: General experience of comfort will improve Outcome: Progressing   Problem: Skin Integrity: Goal: Risk for impaired skin integrity will decrease Outcome: Progressing   

## 2021-03-15 NOTE — Anesthesia Postprocedure Evaluation (Signed)
Anesthesia Post Note  Patient: Robin Alvarez  Procedure(s) Performed: OPEN REDUCTION INTERNAL FIXATION (ORIF) ANKLE FRACTURE (Right: Ankle)     Patient location during evaluation: PACU Anesthesia Type: General Level of consciousness: sedated and patient cooperative Pain management: pain level controlled Vital Signs Assessment: post-procedure vital signs reviewed and stable Respiratory status: spontaneous breathing Cardiovascular status: stable Anesthetic complications: no   No notable events documented.  Last Vitals:  Vitals:   03/14/21 0543 03/14/21 1415  BP: 125/70 114/60  Pulse: 76 84  Resp: 17 18  Temp: (!) 36.4 C 36.8 C  SpO2: 100% 100%    Last Pain:  Vitals:   03/14/21 1415  TempSrc: Oral  PainSc:                  [name]

## 2021-03-18 ENCOUNTER — Encounter (HOSPITAL_COMMUNITY): Payer: Self-pay | Admitting: Orthopedic Surgery

## 2021-11-12 ENCOUNTER — Ambulatory Visit (INDEPENDENT_AMBULATORY_CARE_PROVIDER_SITE_OTHER): Payer: Commercial Managed Care - HMO | Admitting: Internal Medicine

## 2021-11-12 ENCOUNTER — Encounter: Payer: Self-pay | Admitting: Internal Medicine

## 2021-11-12 VITALS — BP 110/70 | HR 100 | Ht 62.0 in | Wt 160.0 lb

## 2021-11-12 DIAGNOSIS — E1065 Type 1 diabetes mellitus with hyperglycemia: Secondary | ICD-10-CM | POA: Diagnosis not present

## 2021-11-12 DIAGNOSIS — E785 Hyperlipidemia, unspecified: Secondary | ICD-10-CM

## 2021-11-12 LAB — LIPID PANEL
Cholesterol: 235 mg/dL — ABNORMAL HIGH (ref 0–200)
HDL: 60.1 mg/dL (ref >39.00)
LDL Cholesterol: 158 mg/dL — ABNORMAL HIGH (ref 0–99)
NonHDL: 175.24
Total CHOL/HDL Ratio: 4
Triglycerides: 87 mg/dL (ref 0.0–149.0)
VLDL: 17.4 mg/dL (ref 0.0–40.0)

## 2021-11-12 LAB — COMPREHENSIVE METABOLIC PANEL
ALT: 20 U/L (ref 0–35)
AST: 16 U/L (ref 0–37)
Albumin: 4.4 g/dL (ref 3.5–5.2)
Alkaline Phosphatase: 95 U/L (ref 39–117)
BUN: 8 mg/dL (ref 6–23)
CO2: 28 mEq/L (ref 19–32)
Calcium: 10 mg/dL (ref 8.4–10.5)
Chloride: 101 mEq/L (ref 96–112)
Creatinine, Ser: 0.72 mg/dL (ref 0.40–1.20)
GFR: 102.93 mL/min (ref >60.00)
Glucose, Bld: 194 mg/dL — ABNORMAL HIGH (ref 70–99)
Potassium: 4.2 mEq/L (ref 3.5–5.1)
Sodium: 137 mEq/L (ref 135–145)
Total Bilirubin: 0.3 mg/dL (ref 0.2–1.2)
Total Protein: 7.5 g/dL (ref 6.0–8.3)

## 2021-11-12 LAB — MICROALBUMIN / CREATININE URINE RATIO
Creatinine,U: 97.4 mg/dL
Microalb Creat Ratio: 1 mg/g (ref 0.0–30.0)
Microalb, Ur: 1 mg/dL (ref 0.0–1.9)

## 2021-11-12 LAB — POCT GLYCOSYLATED HEMOGLOBIN (HGB A1C): Hemoglobin A1C: 8 % — AB (ref 4.0–5.6)

## 2021-11-12 MED ORDER — INSULIN LISPRO 100 UNIT/ML IJ SOLN: INTRAMUSCULAR | 3 refills | Status: DC

## 2021-11-12 MED ORDER — DEXCOM G6 SENSOR MISC: 1.0000 | 3 refills | Status: DC

## 2021-11-12 MED ORDER — DEXCOM G6 TRANSMITTER MISC: 1.0000 | 3 refills | Status: DC

## 2021-11-12 NOTE — Progress Notes (Signed)
Name: Robin Alvarez  MRN/ DOB: [id number], [birth year]   Age/ Sex: 41 y.o., female    PCP: Neldon Labella, PA   Reason for Endocrinology Evaluation: Type 1 Diabetes Mellitus     Date of Initial Endocrinology Visit: 11/12/2021     [name] is a 41 y.o. female with a past medical history of T1DM, Hx of ETOH dependence and depression. The patient presented for initial endocrinology clinic visit on 11/12/2021 for consultative assistance with her diabetes management.    HPI: Robin Alvarez was    Diagnosed with DM at age 1 Prior Medications tried/Intolerance: Has been on the Medtronic for years until 2022 when she switched to Tandem  Currently checking blood sugars multiple  x / day,  through CGM Hypoglycemia episodes : yes                Symptoms: yes                 Frequency: rare Hemoglobin A1c has ranged from 7.3% in 2021, peaking at 8.6% in 2022. Patient required assistance for hypoglycemia: no Patient has required hospitalization within the last 1 year from hyper or hypoglycemia: no  Last DKA 08/2020  In terms of diet, the patient 2.5 meals a day . Snacks occasionally    Transitioned care from [facility]  This patient with type 1 diabetes is treated with Tandem (insulin pump). During the visit the pump basal and bolus doses were reviewed including carb/insulin rations and supplemental doses. The clinical list was updated. The glucose meter download was reviewed in detail to determine if the current pump settings are providing the best glycemic control without excessive hypoglycemia.  Pump and meter download:    Pump   Tandem  Settings   Insulin type   Humalog    Basal rate       0000 1.5 u/h    0600 1.4 u/h    0900 1.6 u/h       I:C ratio       0000 1:10                  Sensitivity       0000  57      Goal       0000  70-110             Type & Model of Pump: Tandem  Insulin Type: Currently using Humalog .  Body mass  index is 29.26 kg/m.  PUMP STATISTICS: Average BG: 179  Average Daily Carbs (g): 91 Average Total Daily Insulin: 65.79  Average Daily Basal: 48.35 (73 %) Average Daily Bolus: 13 (21 %)      HOME DIABETES REGIMEN: Humalog    Statin: has not been on statin in a year ACE-I/ARB: no      DIABETIC COMPLICATIONS: Microvascular complications:   Denies: Neuropathy,retinopathy  Last eye exam: Completed 11/2020  Macrovascular complications:   Denies: CAD, PVD, CVA   PAST HISTORY: Past Medical History:  Past Medical History:  Diagnosis Date   Hyperlipidemia    Type I (juvenile type) diabetes mellitus with hyperosmolarity, not stated as uncontrolled    Past Surgical History:  Past Surgical History:  Procedure Laterality Date   ORIF ANKLE FRACTURE Right 03/13/2021   Procedure: OPEN REDUCTION INTERNAL FIXATION (ORIF) ANKLE FRACTURE;  Surgeon: Yolonda Kida, MD;  Location: [facility];  Service: Orthopedics;  Laterality: Right;    Social History:  reports that she quit smoking  about 11 years ago. Her smoking use included cigarettes. She started smoking about 27 years ago. She smoked an average of 1.5 packs per day. She uses smokeless tobacco. She reports current alcohol use of about 20.0 standard drinks of alcohol per week. She reports current drug use. Frequency: 2.00 times per week. Drug: Marijuana. Family History:  Family History  Problem Relation Age of Onset   Alcohol abuse Father    Alcohol abuse Sister      HOME MEDICATIONS: Allergies as of 11/12/2021       Reactions   Bactrim [sulfamethoxazole-trimethoprim] Hives        Medication List        Accurate as of November 12, 2021  3:02 PM. If you have any questions, ask your nurse or doctor.          STOP taking these medications    insulin aspart 100 UNIT/ML injection Commonly known as: novoLOG Stopped by: Dorita Sciara, MD   ondansetron 4 MG tablet Commonly known as: Zofran Stopped by:  Dorita Sciara, MD   polyvinyl alcohol 1.4 % ophthalmic solution Commonly known as: LIQUIFILM TEARS Stopped by: Dorita Sciara, MD       TAKE these medications    Dexcom G6 Sensor Misc Inject 1 Device into the skin as directed. What changed: See the new instructions. Changed by: Dorita Sciara, MD   Dexcom G6 Transmitter Misc Inject 1 Device into the skin as directed. What changed: See the new instructions. Changed by: Dorita Sciara, MD   gabapentin 300 MG capsule Commonly known as: NEURONTIN Take 1 capsule (300 mg total) by mouth 3 (three) times daily for 15 days.   insulin lispro 100 UNIT/ML injection Commonly known as: HUMALOG Max daily 80 units What changed: See the new instructions. Changed by: Dorita Sciara, MD   norethindrone-ethinyl estradiol 1/35 tablet Commonly known as: ORTHO-NOVUM Take 1 tablet by mouth daily.   oxyCODONE 5 MG immediate release tablet Commonly known as: Roxicodone Take 1 tablet (5 mg total) by mouth every 4 (four) hours as needed for severe pain or breakthrough pain.         ALLERGIES: Allergies  Allergen Reactions   Bactrim [Sulfamethoxazole-Trimethoprim] Hives     REVIEW OF SYSTEMS: A comprehensive ROS was conducted with the patient and is negative except as per HPI and below:  Review of Systems  Gastrointestinal:  Negative for diarrhea, nausea and vomiting.  Neurological:  Negative for tingling.      OBJECTIVE:   VITAL SIGNS: BP 110/70 (BP Location: Left Arm, Patient Position: Sitting, Cuff Size: Small)   Pulse 100   Ht 5\' 2"  (1.575 m)   Wt 160 lb (72.6 kg)   SpO2 97%   BMI 29.26 kg/m    PHYSICAL EXAM:  General: Pt appears well and is in NAD  Neck: General: Supple without adenopathy or carotid bruits. Thyroid: Thyroid size normal.  No goiter or nodules appreciated.   Lungs: Clear with good BS bilat with no rales, rhonchi, or wheezes  Heart: RRR with normal S1 and S2 and no  gallops; no murmurs; no rub  Abdomen: Normoactive bowel sounds, soft, nontender, without masses or organomegaly palpable  Extremities:  Lower extremities - No pretibial edema. No lesions.  Neuro: MS is good with appropriate affect, pt is alert and Ox3    DM foot exam: 11/12/2021  The skin of the feet is intact without sores or ulcerations. The pedal pulses are 2+ on right and  2+ on left. The sensation is intact to a screening 5.07, 10 gram monofilament bilaterally    DATA REVIEWED:  Lab Results  Component Value Date   HGBA1C 8.0 (A) 11/12/2021   HGBA1C 6.7 (H) 03/11/2021   HGBA1C 8.7 (H) 09/11/2020     Latest Reference Range & Units 11/12/21 10:44  COMPREHENSIVE METABOLIC PANEL  Rpt !  Sodium 135 - 145 mEq/L 137  Potassium 3.5 - 5.1 mEq/L 4.2  Chloride 96 - 112 mEq/L 101  CO2 19 - 32 mEq/L 28  Glucose 70 - 99 mg/dL 194 (H)  BUN 6 - 23 mg/dL 8  Creatinine 0.40 - 1.20 mg/dL 0.72  Calcium 8.4 - 10.5 mg/dL 10.0  Alkaline Phosphatase 39 - 117 U/L 95  Albumin 3.5 - 5.2 g/dL 4.4  AST 0 - 37 U/L 16  ALT 0 - 35 U/L 20  Total Protein 6.0 - 8.3 g/dL 7.5  Total Bilirubin 0.2 - 1.2 mg/dL 0.3  GFR >60.00 mL/min 102.93  Total CHOL/HDL Ratio  4  Cholesterol 0 - 200 mg/dL 235 (H)  HDL Cholesterol >39.00 mg/dL 60.10  LDL (calc) 0 - 99 mg/dL 158 (H)  MICROALB/CREAT RATIO 0.0 - 30.0 mg/g 1.0  NonHDL  175.24  Triglycerides 0.0 - 149.0 mg/dL 87.0  VLDL 0.0 - 40.0 mg/dL 17.4     Latest Reference Range & Units 11/12/21 10:44  Creatinine,U mg/dL 97.4  Microalb, Ur 0.0 - 1.9 mg/dL 1.0  MICROALB/CREAT RATIO 0.0 - 30.0 mg/g 1.0     ASSESSMENT / PLAN / RECOMMENDATIONS:   1) Type 2 Diabetes Mellitus, Poorly controlled, Without complications - Most recent A1c of 8.0 %. Goal A1c < 7.0 %.    -A1c above goal -She is to be on the Medtronic but has been on the tandem since approximately 2022 -Interview of her pump downloads the patient has been noted with hyperglycemia in the latter part  of the evening, I will adjust her insulin to carb ratio as well as basal rate -CGM and Humalog refilled   MEDICATIONS: Humalog   Pump   Tandem  Settings   Insulin type   Humalog    Basal rate       0000 1.5 u/h    0600 1.4 u/h    0900 1.6 u/h    1800 1.6  I:C ratio       0000 1:10   1800 1:8              Sensitivity       0000  60            EDUCATION / INSTRUCTIONS: BG monitoring instructions: Patient is instructed to check her blood sugars 3 times a day, before meals. Call [facility] if: BG persistently < 70  I reviewed the Rule of 15 for the treatment of hypoglycemia in detail with the patient. Literature supplied.   2) Diabetic complications:  Eye: Does not have known diabetic retinopathy.  Neuro/ Feet: Does not have known diabetic peripheral neuropathy. Renal: Patient does not have known baseline CKD. She is not on an ACEI/ARB at present.   3)Dyslipidemia :   -She was on statin therapy but has been out for approximately a year -LDL above goal, discussed cardiovascular benefits of statin therapy -We will restart   Medication Start atorvastatin 20 mg daily  Follow-up in 4 months  Signed electronically by: Mack Guise, MD  [facility]  [facility] [address],  [place] [address] Phone: [phone] FAX: [phone]   CC: [place], [employer], [place] [address] [place] [address] Phone: [phone]  Fax: [phone]    Return to Endocrinology clinic as below: Future Appointments  Date Time Provider Department Center  03/22/2022  9:50 AM Casy Tavano, Konrad Dolores, MD [id number] None

## 2021-11-12 NOTE — Patient Instructions (Addendum)
  Pump   Tandem  Settings   Insulin type   Humalog    Basal rate       0000 1.5 u/h    0600 1.4 u/h    0900 1.6 u/h    1800 1.6  I:C ratio       0000 1:10   1800 1:8              Sensitivity       0000  60           HOW TO TREAT LOW BLOOD SUGARS (Blood sugar LESS THAN 70 MG/DL) Please follow the RULE OF 15 for the treatment of hypoglycemia treatment (when your (blood sugars are less than 70 mg/dL)   STEP 1: Take 15 grams of carbohydrates when your blood sugar is low, which includes:  3-4 GLUCOSE TABS  OR 3-4 OZ OF JUICE OR REGULAR SODA OR ONE TUBE OF GLUCOSE GEL    STEP 2: RECHECK blood sugar in 15 MINUTES STEP 3: If your blood sugar is still low at the 15 minute recheck --> then, go back to STEP 1 and treat AGAIN with another 15 grams of carbohydrates.

## 2021-11-13 ENCOUNTER — Encounter: Payer: Self-pay | Admitting: Internal Medicine

## 2021-11-13 MED ORDER — ATORVASTATIN CALCIUM 20 MG PO TABS: 20.0000 mg | ORAL_TABLET | Freq: Every day | ORAL | 3 refills | Status: DC

## 2021-11-16 LAB — HM DIABETES EYE EXAM

## 2021-12-25 ENCOUNTER — Encounter: Payer: Self-pay | Admitting: Internal Medicine

## 2022-02-09 ENCOUNTER — Other Ambulatory Visit: Payer: Self-pay | Admitting: Internal Medicine

## 2022-02-09 DIAGNOSIS — E1065 Type 1 diabetes mellitus with hyperglycemia: Secondary | ICD-10-CM

## 2022-03-22 ENCOUNTER — Encounter: Payer: Self-pay | Admitting: Internal Medicine

## 2022-03-22 ENCOUNTER — Ambulatory Visit: Payer: Commercial Managed Care - HMO | Admitting: Internal Medicine

## 2022-03-22 VITALS — BP 122/80 | HR 75 | Ht 62.0 in | Wt 164.0 lb

## 2022-03-22 DIAGNOSIS — E1065 Type 1 diabetes mellitus with hyperglycemia: Secondary | ICD-10-CM

## 2022-03-22 DIAGNOSIS — E785 Hyperlipidemia, unspecified: Secondary | ICD-10-CM | POA: Diagnosis not present

## 2022-03-22 LAB — POCT GLYCOSYLATED HEMOGLOBIN (HGB A1C): Hemoglobin A1C: 7.2 % — AB (ref 4.0–5.6)

## 2022-03-22 NOTE — Patient Instructions (Signed)

## 2022-03-22 NOTE — Progress Notes (Signed)
Name: Robin Alvarez  MRN/ DOB: [id number], 1980/11/28   Age/ Sex: 41 y.o., female    PCP: Precious Gilding, PA   Reason for Endocrinology Evaluation: Type 1 Diabetes Mellitus     Date of Initial Endocrinology Visit: 11/12/2021    [name] is a 41 y.o. female with a past medical history of T1DM, Hx of ETOH dependence and depression. The patient presented for initial endocrinology clinic visit on 11/12/2021  for consultative assistance with her diabetes management.    HPI: Robin Alvarez was    Diagnosed with DM at age 47 Prior Medications tried/Intolerance: Has been on the Medtronic for years until 2022 when she switched to Tandem  Hemoglobin A1c has ranged from 7.3% in 2021, peaking at 8.6% in 2022.   Last DKA 08/2020   Transitioned care from [facility]  On her initial visit to our clinic she had an A1c of 8.0%, we adjusted pump settings  SUBJECTIVE:   During the last visit (11/12/2021): A1c 8.0%  Today (03/22/22):Robin Alvarez is here for follow-up on diabetes management.  She checks her blood sugars multiple times daily. The patient has had hypoglycemic episodes since the last clinic visit, which typically occur in the evening. The patient is  symptomatic with these episodes.   Works at [employer] 2 pm to 10 pm   Denies nausea, vomiting or diarrhea    This patient with type 1 diabetes is treated with Tandem (insulin pump). During the visit the pump basal and bolus doses were reviewed including carb/insulin rations and supplemental doses. The clinical list was updated. The glucose meter download was reviewed in detail to determine if the current pump settings are providing the best glycemic control without excessive hypoglycemia.  Pump and meter download:    Pump   Tandem  Settings   Insulin type   Humalog    Basal rate       0000 1.5 u/h    0600 1.4 u/h    0900 1.6 u/h    1800 1.6  I:C ratio       0000 1:10   1800 1:10               Sensitivity       0000  60      Goal       0000  110             Type & Model of Pump: Tandem  Insulin Type: Currently using Humalog .  Body mass index is 30 kg/m.  PUMP STATISTICS: Average BG: 154 Average Daily Carbs (g): 73 Average Total Daily Insulin: 49.04 Average Daily Basal: 38 (77 %) Average Daily Bolus: 12 (24 %)   CONTINUOUS GLUCOSE MONITORING RECORD INTERPRETATION    Dates of Recording: 10/23-11/09/2021  Sensor description: dexcom  Results statistics:   CGM use % of time 98  Average and SD 154/49  Time in range     74   %  % Time Above 180 24  % Time Below target 1   Glycemic patterns summary: Bg's mostly optimal through the day and night with occasional hypoglycemia and hyperglycemia   Hyperglycemic episodes  postprandial   Hypoglycemic episodes occurred during the day  Overnight periods: stable     HOME DIABETES REGIMEN: Humalog    Statin: yes  ACE-I/ARB: no      DIABETIC COMPLICATIONS: Microvascular complications:   Denies: Neuropathy,retinopathy  Last eye exam: Completed 11/2021  Macrovascular complications:  Denies: CAD, PVD, CVA   PAST HISTORY: Past Medical History:  Past Medical History:  Diagnosis Date   Hyperlipidemia    Type I (juvenile type) diabetes mellitus with hyperosmolarity, not stated as uncontrolled    Past Surgical History:  Past Surgical History:  Procedure Laterality Date   ORIF ANKLE FRACTURE Right 03/13/2021   Procedure: OPEN REDUCTION INTERNAL FIXATION (ORIF) ANKLE FRACTURE;  Surgeon: Nicholes Stairs, MD;  Location: [facility];  Service: Orthopedics;  Laterality: Right;    Social History:  reports that she quit smoking about 11 years ago. Her smoking use included cigarettes. She started smoking about 27 years ago. She smoked an average of 1.5 packs per day. She uses smokeless tobacco. She reports current alcohol use of about 20.0 standard drinks of alcohol per week. She reports current  drug use. Frequency: 2.00 times per week. Drug: Marijuana. Family History:  Family History  Problem Relation Age of Onset   Alcohol abuse Father    Alcohol abuse Sister      HOME MEDICATIONS: Allergies as of 03/22/2022       Reactions   Bactrim [sulfamethoxazole-trimethoprim] Hives        Medication List        Accurate as of March 22, 2022 10:17 AM. If you have any questions, ask your nurse or doctor.          atorvastatin 20 MG tablet Commonly known as: LIPITOR Take 1 tablet (20 mg total) by mouth daily.   Dexcom G6 Sensor Misc Inject 1 Device into the skin as directed.   Dexcom G6 Transmitter Misc Inject 1 Device into the skin as directed.   gabapentin 300 MG capsule Commonly known as: NEURONTIN Take 1 capsule (300 mg total) by mouth 3 (three) times daily for 15 days.   insulin lispro 100 UNIT/ML injection Commonly known as: HUMALOG Max daily 80 units   norethindrone-ethinyl estradiol 1/35 tablet Commonly known as: ORTHO-NOVUM Take 1 tablet by mouth daily.         ALLERGIES: Allergies  Allergen Reactions   Bactrim [Sulfamethoxazole-Trimethoprim] Hives         OBJECTIVE:   VITAL SIGNS: BP 122/80 (BP Location: Left Arm, Patient Position: Sitting, Cuff Size: Small)   Pulse 75   Ht 5\' 2"  (1.575 m)   Wt 164 lb (74.4 kg)   SpO2 99%   BMI 30.00 kg/m    PHYSICAL EXAM:  General: Pt appears well and is in NAD  Neck: General: Supple without adenopathy or carotid bruits. Thyroid: Thyroid size normal.  No goiter or nodules appreciated.   Lungs: Clear with good BS bilat with no rales, rhonchi, or wheezes  Heart: RRR with normal S1 and S2 and no gallops; no murmurs; no rub  Abdomen: Normoactive bowel sounds, soft, nontender, without masses or organomegaly palpable  Extremities:  Lower extremities - No pretibial edema. No lesions.  Neuro: MS is good with appropriate affect, pt is alert and Ox3    DM foot exam: 11/12/2021  The skin of the  feet is intact without sores or ulcerations. The pedal pulses are 2+ on right and 2+ on left. The sensation is intact to a screening 5.07, 10 gram monofilament bilaterally    DATA REVIEWED:  Lab Results  Component Value Date   HGBA1C 7.2 (A) 03/22/2022   HGBA1C 8.0 (A) 11/12/2021   HGBA1C 6.7 (H) 03/11/2021     Latest Reference Range & Units 11/12/21 10:44  COMPREHENSIVE METABOLIC PANEL  Rpt !  Sodium  135 - 145 mEq/L 137  Potassium 3.5 - 5.1 mEq/L 4.2  Chloride 96 - 112 mEq/L 101  CO2 19 - 32 mEq/L 28  Glucose 70 - 99 mg/dL 194 (H)  BUN 6 - 23 mg/dL 8  Creatinine 0.40 - 1.20 mg/dL 0.72  Calcium 8.4 - 10.5 mg/dL 10.0  Alkaline Phosphatase 39 - 117 U/L 95  Albumin 3.5 - 5.2 g/dL 4.4  AST 0 - 37 U/L 16  ALT 0 - 35 U/L 20  Total Protein 6.0 - 8.3 g/dL 7.5  Total Bilirubin 0.2 - 1.2 mg/dL 0.3  GFR >60.00 mL/min 102.93  Total CHOL/HDL Ratio  4  Cholesterol 0 - 200 mg/dL 235 (H)  HDL Cholesterol >39.00 mg/dL 60.10  LDL (calc) 0 - 99 mg/dL 158 (H)  MICROALB/CREAT RATIO 0.0 - 30.0 mg/g 1.0  NonHDL  175.24  Triglycerides 0.0 - 149.0 mg/dL 87.0  VLDL 0.0 - 40.0 mg/dL 17.4     Latest Reference Range & Units 11/12/21 10:44  Creatinine,U mg/dL 97.4  Microalb, Ur 0.0 - 1.9 mg/dL 1.0  MICROALB/CREAT RATIO 0.0 - 30.0 mg/g 1.0     ASSESSMENT / PLAN / RECOMMENDATIONS:   1) Type 2 Diabetes Mellitus, Optimally controlled, Without complications - Most recent A1c of 7.2  %. Goal A1c < 7.0 %.    -A1c has improved  - She endorses hypoglycemia, will reduce basal rate  - She has been noted with postprandial hyperglycemia, I have encouraged her to try to be more accurate with CHO  entry, if this remains elevated, will adjust I: c ratio on next visit    MEDICATIONS: Humalog   Pump   Tandem  Settings   Insulin type   Humalog    Basal rate       0000 1.5 u/h    0600 1.3 u/h    0900 1.5 u/h    1800 1.55  I:C ratio       0000 1:10   1800 1:10              Sensitivity        0000  60            EDUCATION / INSTRUCTIONS: BG monitoring instructions: Patient is instructed to check her blood sugars 3 times a day, before meals. Call [facility] if: BG persistently < 70  I reviewed the Rule of 15 for the treatment of hypoglycemia in detail with the patient. Literature supplied.   2) Diabetic complications:  Eye: Does not have known diabetic retinopathy.  Neuro/ Feet: Does not have known diabetic peripheral neuropathy. Renal: Patient does not have known baseline CKD. She is not on an ACEI/ARB at present.   3)Dyslipidemia :   -She had restarted atorvastatin without side effects - Will recheck on next visit    Medication Continue atorvastatin 20 mg daily     Follow-up in 4 months  Signed electronically by: Mack Guise, MD  [facility] End[facility]in[facility]  [facility] [address] [place],  [address] Phone: [phone] FAX: [phone]   CC: [place], [place], [place] [address] [place] [address] Phone: [phone]  Fax: [phone]    Return to Endocrinology clinic as below: No future appointments.

## 2022-07-27 ENCOUNTER — Ambulatory Visit: Payer: Commercial Managed Care - HMO | Admitting: Internal Medicine

## 2022-07-28 ENCOUNTER — Encounter: Payer: Self-pay | Admitting: Internal Medicine

## 2022-07-28 ENCOUNTER — Ambulatory Visit: Payer: BC Managed Care – PPO | Admitting: Internal Medicine

## 2022-07-28 VITALS — BP 114/74 | HR 75 | Ht 62.0 in | Wt 172.0 lb

## 2022-07-28 DIAGNOSIS — E785 Hyperlipidemia, unspecified: Secondary | ICD-10-CM

## 2022-07-28 DIAGNOSIS — E1065 Type 1 diabetes mellitus with hyperglycemia: Secondary | ICD-10-CM

## 2022-07-28 LAB — POCT GLYCOSYLATED HEMOGLOBIN (HGB A1C): Hemoglobin A1C: 6.3 % — AB (ref 4.0–5.6)

## 2022-07-28 NOTE — Patient Instructions (Signed)

## 2022-07-28 NOTE — Progress Notes (Signed)
Name: Robin Alvarez  MRN/ DOB: [id number], June 26, 1980   Age/ Sex: 42 y.o., female    PCP: Precious Gilding, PA   Reason for Endocrinology Evaluation: Type 1 Diabetes Mellitus     Date of Initial Endocrinology Visit: 11/12/2021    PATIENT IDENTIFIER: [name] is a 42 y.o. female with a past medical history of T1DM, Hx of ETOH dependence and depression. The patient presented for initial endocrinology clinic visit on 11/12/2021  for consultative assistance with her diabetes management.    HPI: Ms. Valdespino was    Diagnosed with DM at age 58 Prior Medications tried/Intolerance: Has been on the Medtronic for years until 2022 when she switched to Tandem  Hemoglobin A1c has ranged from 7.3% in 2021, peaking at 8.6% in 2022.   Last DKA 08/2020   Transitioned care from [facility]  On her initial visit to our clinic she had an A1c of 8.0%, we adjusted pump settings  SUBJECTIVE:   During the last visit (03/22/2022): A1c 7.2%      Today (07/28/22):Robin Alvarez is here for follow-up on diabetes management.  She checks her blood sugars multiple times daily. The patient has had hypoglycemic episodes since the last clinic visit, which typically occur in the morning. The patient is  symptomatic with these episodes.   Works at [employer] 2 pm to 10 pm  Denies nausea, vomiting or diarrhea    This patient with type 1 diabetes is treated with Tandem (insulin pump). During the visit the pump basal and bolus doses were reviewed including carb/insulin rations and supplemental doses. The clinical list was updated. The glucose meter download was reviewed in detail to determine if the current pump settings are providing the best glycemic control without excessive hypoglycemia.  Pump and meter download:    Pump   Tandem  Settings   Insulin type   Humalog    Basal rate       0000 1.5 u/h    0600 1.3 u/h    0900 1.5 u/h    1800 1.55  I:C ratio       0000 1:10   1800 1:10               Sensitivity       0000  60                 Type & Model of Pump: Tandem  Insulin Type: Currently using Humalog .  Body mass index is 31.46 kg/m.  PUMP STATISTICS: Unable to download   CONTINUOUS GLUCOSE MONITORING RECORD INTERPRETATION    Unable to download    HOME DIABETES REGIMEN: Humalog    Statin: yes  ACE-I/ARB: no      DIABETIC COMPLICATIONS: Microvascular complications:   Denies: Neuropathy,retinopathy  Last eye exam: Completed 11/16/2021  Macrovascular complications:   Denies: CAD, PVD, CVA   PAST HISTORY: Past Medical History:  Past Medical History:  Diagnosis Date   Hyperlipidemia    Type I (juvenile type) diabetes mellitus with hyperosmolarity, not stated as uncontrolled    Past Surgical History:  Past Surgical History:  Procedure Laterality Date   ORIF ANKLE FRACTURE Right 03/13/2021   Procedure: OPEN REDUCTION INTERNAL FIXATION (ORIF) ANKLE FRACTURE;  Surgeon: Nicholes Stairs, MD;  Location: [facility];  Service: Orthopedics;  Laterality: Right;    Social History:  reports that she quit smoking about 11 years ago. Her smoking use included cigarettes. She started smoking about 27 years ago. She  smoked an average of 1.5 packs per day. She uses smokeless tobacco. She reports current alcohol use of about 20.0 standard drinks of alcohol per week. She reports current drug use. Frequency: 2.00 times per week. Drug: Marijuana. Family History:  Family History  Problem Relation Age of Onset   Alcohol abuse Father    Alcohol abuse Sister      HOME MEDICATIONS: Allergies as of 07/28/2022       Reactions   Bactrim [sulfamethoxazole-trimethoprim] Hives        Medication List        Accurate as of July 28, 2022 11:43 AM. If you have any questions, ask your nurse or doctor.          atorvastatin 20 MG tablet Commonly known as: LIPITOR Take 1 tablet (20 mg total) by mouth daily.   Dexcom G6 Sensor Misc Inject  1 Device into the skin as directed.   Dexcom G6 Transmitter Misc Inject 1 Device into the skin as directed.   gabapentin 300 MG capsule Commonly known as: NEURONTIN Take 1 capsule (300 mg total) by mouth 3 (three) times daily for 15 days.   insulin lispro 100 UNIT/ML injection Commonly known as: HUMALOG Max daily 80 units   norethindrone-ethinyl estradiol 1/35 tablet Commonly known as: ORTHO-NOVUM Take 1 tablet by mouth daily.         ALLERGIES: Allergies  Allergen Reactions   Bactrim [Sulfamethoxazole-Trimethoprim] Hives         OBJECTIVE:   VITAL SIGNS: BP 114/74 (BP Location: Left Arm, Patient Position: Sitting, Cuff Size: Large)   Pulse 75   Ht 5\' 2"  (1.575 m)   Wt 172 lb (78 kg)   SpO2 99%   BMI 31.46 kg/m    PHYSICAL EXAM:  General: Pt appears well and is in NAD  Neck: General: Supple without adenopathy or carotid bruits. Thyroid: Thyroid size normal.  No goiter or nodules appreciated.   Lungs: Clear with good BS bilat with no rales, rhonchi, or wheezes  Heart: RRR   Abdomen: soft, nontender, without masses or organomegaly palpable  Extremities:  Lower extremities - No pretibial edema. No lesions.  Neuro: MS is good with appropriate affect, pt is alert and Ox3    DM foot exam: 07/28/2022  The skin of the feet is intact without sores or ulcerations. The pedal pulses are 2+ on right and 2+ on left. The sensation is intact to a screening 5.07, 10 gram monofilament bilaterally    DATA REVIEWED:  Lab Results  Component Value Date   HGBA1C 7.2 (A) 03/22/2022   HGBA1C 8.0 (A) 11/12/2021   HGBA1C 6.7 (H) 03/11/2021     Latest Reference Range & Units 11/12/21 10:44  COMPREHENSIVE METABOLIC PANEL  Rpt !  Sodium 135 - 145 mEq/L 137  Potassium 3.5 - 5.1 mEq/L 4.2  Chloride 96 - 112 mEq/L 101  CO2 19 - 32 mEq/L 28  Glucose 70 - 99 mg/dL 194 (H)  BUN 6 - 23 mg/dL 8  Creatinine 0.40 - 1.20 mg/dL 0.72  Calcium 8.4 - 10.5 mg/dL 10.0  Alkaline  Phosphatase 39 - 117 U/L 95  Albumin 3.5 - 5.2 g/dL 4.4  AST 0 - 37 U/L 16  ALT 0 - 35 U/L 20  Total Protein 6.0 - 8.3 g/dL 7.5  Total Bilirubin 0.2 - 1.2 mg/dL 0.3  GFR >60.00 mL/min 102.93  Total CHOL/HDL Ratio  4  Cholesterol 0 - 200 mg/dL 235 (H)  HDL Cholesterol >39.00 mg/dL 60.10  LDL (calc) 0 - 99 mg/dL 158 (H)  MICROALB/CREAT RATIO 0.0 - 30.0 mg/g 1.0  NonHDL  175.24  Triglycerides 0.0 - 149.0 mg/dL 87.0  VLDL 0.0 - 40.0 mg/dL 17.4     Latest Reference Range & Units 11/12/21 10:44  Creatinine,U mg/dL 97.4  Microalb, Ur 0.0 - 1.9 mg/dL 1.0  MICROALB/CREAT RATIO 0.0 - 30.0 mg/g 1.0     ASSESSMENT / PLAN / RECOMMENDATIONS:   1) Type 2 Diabetes Mellitus, Optimally controlled, Without complications - Most recent A1c of 6.3  %. Goal A1c < 7.0 %.    - Praised the pt on improved glycemic control  -We have not been able to download her CGM nor the tandem pump today -But based on her report of hypoglycemia around 8 in the morning, I have reduced her basal rate around 6 AM   MEDICATIONS: Humalog   Pump   Tandem  Settings   Insulin type   Humalog    Basal rate       0000 1.5 u/h    0600 1.2 u/h    0900 1.5 u/h    1800 1.55  I:C ratio       0000 1:10   1800 1:10              Sensitivity       0000  60            EDUCATION / INSTRUCTIONS: BG monitoring instructions: Patient is instructed to check her blood sugars 3 times a day, before meals. Call [facility] if: BG persistently < 70  I reviewed the Rule of 15 for the treatment of hypoglycemia in detail with the patient. Literature supplied.   2) Diabetic complications:  Eye: Does not have known diabetic retinopathy.  Neuro/ Feet: Does not have known diabetic peripheral neuropathy. Renal: Patient does not have known baseline CKD. She is not on an ACEI/ARB at present.   3)Dyslipidemia :   -She had restarted atorvastatin without side effects - Will recheck on next visit     Medication Continue atorvastatin 20 mg daily     Follow-up in 6 months  Signed electronically by: Mack Guise, MD  [facility]  [facility] [address] [place], [place] [address] Phone: [phone] FAX: [phone]   CC: [place], [place], [place] [address] [place] [address] Phone: [phone]  Fax: [phone]    Return to Endocrinology clinic as below: No future appointments.

## 2022-07-30 MED ORDER — ATORVASTATIN CALCIUM 20 MG PO TABS: 20.0000 mg | ORAL_TABLET | Freq: Every day | ORAL | 3 refills | Status: DC

## 2022-07-30 MED ORDER — INSULIN LISPRO 100 UNIT/ML IJ SOLN: INTRAMUSCULAR | 3 refills | Status: DC

## 2022-08-24 ENCOUNTER — Encounter: Payer: Self-pay | Admitting: Internal Medicine

## 2022-08-30 ENCOUNTER — Telehealth: Payer: Self-pay

## 2022-08-30 NOTE — Telephone Encounter (Signed)
Patient calling for updated on the Dexcom sensors and transmitter.

## 2022-08-30 NOTE — Telephone Encounter (Signed)
Patient Advocate Encounter   Received notification from pt msgs that prior authorization is required for Dexcom G6 Sensor  Submitted: 08/30/22 Key [id number]  Prior Authorization has been approved by [employer]  (ins).    PA # [id number] Effective dates: 08/30/22 through 08/30/23

## 2022-08-31 ENCOUNTER — Other Ambulatory Visit (HOSPITAL_COMMUNITY): Payer: Self-pay

## 2022-08-31 NOTE — Telephone Encounter (Signed)
Test claim shows that one was not needed for the transmitter    Copay is $60.74

## 2022-12-25 ENCOUNTER — Other Ambulatory Visit: Payer: Self-pay | Admitting: Internal Medicine

## 2022-12-25 DIAGNOSIS — E1065 Type 1 diabetes mellitus with hyperglycemia: Secondary | ICD-10-CM

## 2023-02-01 ENCOUNTER — Ambulatory Visit: Payer: BC Managed Care – PPO | Admitting: Internal Medicine

## 2023-02-02 ENCOUNTER — Other Ambulatory Visit: Payer: Self-pay | Admitting: Internal Medicine

## 2023-02-02 DIAGNOSIS — E1065 Type 1 diabetes mellitus with hyperglycemia: Secondary | ICD-10-CM

## 2023-02-14 NOTE — Progress Notes (Deleted)
Name: Robin Alvarez  MRN/ DOB: [id number], 1980/11/12   Age/ Sex: 42 y.o., female    PCP: Neldon Labella, PA   Reason for Endocrinology Evaluation: Type 1 Diabetes Mellitus     Date of Initial Endocrinology Visit: 11/12/2021    PATIENT IDENTIFIER: [name] is a 42 y.o. female with a past medical history of T1DM, Hx of ETOH dependence and depression. The patient presented for initial endocrinology clinic visit on 11/12/2021  for consultative assistance with her diabetes management.    HPI: [name] was    Diagnosed with DM at age 47 Prior Medications tried/Intolerance: Has been on the Medtronic for years until 2022 when she switched to Tandem  Hemoglobin A1c has ranged from 7.3% in 2021, peaking at 8.6% in 2022.   Last DKA 08/2020   Transitioned care from [facility]  On her initial visit to our clinic she had an A1c of 8.0%, we adjusted pump settings  SUBJECTIVE:   During the last visit (07/28/2022): A1c 6.3%      Today (02/14/23):Naylee D Bastedo is here for follow-up on diabetes management.  She checks her blood sugars multiple times daily. The patient has had hypoglycemic episodes since the last clinic visit, which typically occur in the morning. The patient is  symptomatic with these episodes.   Works at [employer] 2 pm to 10 pm  Denies nausea, vomiting or diarrhea    This patient with type 1 diabetes is treated with Tandem (insulin pump). During the visit the pump basal and bolus doses were reviewed including carb/insulin rations and supplemental doses. The clinical list was updated. The glucose meter download was reviewed in detail to determine if the current pump settings are providing the best glycemic control without excessive hypoglycemia.  Pump and meter download:    Pump   Tandem  Settings   Insulin type   Humalog    Basal rate       0000 1.5 u/h    0600 1.2 u/h    0900 1.5 u/h    1800 1.55  I:C ratio       0000 1:10   1800 1:10               Sensitivity       0000  60                 Type & Model of Pump: Tandem  Insulin Type: Currently using Humalog .  There is no height or weight on file to calculate BMI.  PUMP STATISTICS: Unable to download   CONTINUOUS GLUCOSE MONITORING RECORD INTERPRETATION    Unable to download    HOME DIABETES REGIMEN: Humalog    Statin: yes  ACE-I/ARB: no      DIABETIC COMPLICATIONS: Microvascular complications:   Denies: Neuropathy,retinopathy  Last eye exam: Completed 11/16/2021  Macrovascular complications:   Denies: CAD, PVD, CVA   PAST HISTORY: Past Medical History:  Past Medical History:  Diagnosis Date   Hyperlipidemia    Type I (juvenile type) diabetes mellitus with hyperosmolarity, not stated as uncontrolled    Past Surgical History:  Past Surgical History:  Procedure Laterality Date   ORIF ANKLE FRACTURE Right 03/13/2021   Procedure: OPEN REDUCTION INTERNAL FIXATION (ORIF) ANKLE FRACTURE;  Surgeon: Yolonda Kida, MD;  Location: [facility];  Service: Orthopedics;  Laterality: Right;    Social History:  reports that she quit smoking about 12 years ago. Her smoking use included cigarettes. She started smoking  about 28 years ago. She has a 24 pack-year smoking history. She uses smokeless tobacco. She reports current alcohol use of about 20.0 standard drinks of alcohol per week. She reports current drug use. Frequency: 2.00 times per week. Drug: Marijuana. Family History:  Family History  Problem Relation Age of Onset   Alcohol abuse Father    Alcohol abuse Sister      HOME MEDICATIONS: Allergies as of 02/15/2023       Reactions   Bactrim [sulfamethoxazole-trimethoprim] Hives        Medication List        Accurate as of February 14, 2023  1:48 PM. If you have any questions, ask your nurse or doctor.          atorvastatin 20 MG tablet Commonly known as: LIPITOR Take 1 tablet (20 mg total) by mouth daily.   Dexcom  G6 Sensor Misc INJECT 1 DEVICE INTO THE SKIN AS DIRECTED. REPLACE EVERY 10 DAYS   Dexcom G6 Transmitter Misc USE TO MONITOR BLOOD GLUCOSE CONTINUOUSLY AS DIRECTED   gabapentin 300 MG capsule Commonly known as: NEURONTIN Take 1 capsule (300 mg total) by mouth 3 (three) times daily for 15 days.   insulin lispro 100 UNIT/ML injection Commonly known as: HUMALOG Max daily 80 units   norethindrone-ethinyl estradiol 1/35 tablet Commonly known as: ORTHO-NOVUM Take 1 tablet by mouth daily.         ALLERGIES: Allergies  Allergen Reactions   Bactrim [Sulfamethoxazole-Trimethoprim] Hives         OBJECTIVE:   VITAL SIGNS: There were no vitals taken for this visit.   PHYSICAL EXAM:  General: Pt appears well and is in NAD  Neck: General: Supple without adenopathy or carotid bruits. Thyroid: Thyroid size normal.  No goiter or nodules appreciated.   Lungs: Clear with good BS bilat with no rales, rhonchi, or wheezes  Heart: RRR   Abdomen: soft, nontender, without masses or organomegaly palpable  Extremities:  Lower extremities - No pretibial edema. No lesions.  Neuro: MS is good with appropriate affect, pt is alert and Ox3    DM foot exam: 07/28/2022  The skin of the feet is intact without sores or ulcerations. The pedal pulses are 2+ on right and 2+ on left. The sensation is intact to a screening 5.07, 10 gram monofilament bilaterally    DATA REVIEWED:  Lab Results  Component Value Date   HGBA1C 6.3 (A) 07/28/2022   HGBA1C 7.2 (A) 03/22/2022   HGBA1C 8.0 (A) 11/12/2021     Latest Reference Range & Units 11/12/21 10:44  COMPREHENSIVE METABOLIC PANEL  Rpt !  Sodium 135 - 145 mEq/L 137  Potassium 3.5 - 5.1 mEq/L 4.2  Chloride 96 - 112 mEq/L 101  CO2 19 - 32 mEq/L 28  Glucose 70 - 99 mg/dL 829 (H)  BUN 6 - 23 mg/dL 8  Creatinine 5.62 - 1.30 mg/dL 8.65  Calcium 8.4 - 78.4 mg/dL 69.6  Alkaline Phosphatase 39 - 117 U/L 95  Albumin 3.5 - 5.2 g/dL 4.4  AST 0 - 37  U/L 16  ALT 0 - 35 U/L 20  Total Protein 6.0 - 8.3 g/dL 7.5  Total Bilirubin 0.2 - 1.2 mg/dL 0.3  GFR >29.52 mL/min 102.93  Total CHOL/HDL Ratio  4  Cholesterol 0 - 200 mg/dL 841 (H)  HDL Cholesterol >39.00 mg/dL 32.44  LDL (calc) 0 - 99 mg/dL 010 (H)  MICROALB/CREAT RATIO 0.0 - 30.0 mg/g 1.0  NonHDL  175.24  Triglycerides 0.0 -  149.0 mg/dL 78.2  VLDL 0.0 - 95.6 mg/dL 21.3     Latest Reference Range & Units 11/12/21 10:44  Creatinine,U mg/dL 08.6  Microalb, Ur 0.0 - 1.9 mg/dL 1.0  MICROALB/CREAT RATIO 0.0 - 30.0 mg/g 1.0     ASSESSMENT / PLAN / RECOMMENDATIONS:   1) Type 2 Diabetes Mellitus, Optimally controlled, Without complications - Most recent A1c of 6.3  %. Goal A1c < 7.0 %.    - Praised the pt on improved glycemic control  -We have not been able to download her CGM nor the tandem pump today -But based on her report of hypoglycemia around 8 in the morning, I have reduced her basal rate around 6 AM   MEDICATIONS: Humalog   Pump   Tandem  Settings   Insulin type   Humalog    Basal rate       0000 1.5 u/h    0600 1.2 u/h    0900 1.5 u/h    1800 1.55  I:C ratio       0000 1:10   1800 1:10              Sensitivity       0000  60            EDUCATION / INSTRUCTIONS: BG monitoring instructions: Patient is instructed to check her blood sugars 3 times a day, before meals. Call [facility] if: BG persistently < 70  I reviewed the Rule of 15 for the treatment of hypoglycemia in detail with the patient. Literature supplied.   2) Diabetic complications:  Eye: Does not have known diabetic retinopathy.  Neuro/ Feet: Does not have known diabetic peripheral neuropathy. Renal: Patient does not have known baseline CKD. She is not on an ACEI/ARB at present.   3)Dyslipidemia :   -She had restarted atorvastatin without side effects - Will recheck on next visit    Medication Continue atorvastatin 20 mg daily     Follow-up in 6  months  Signed electronically by: Lyndle Herrlich, MD  [facility]  [facility] [address] [place] [place], [place] [address] Phone: [phone] FAX: [phone]   CC: [place], [place], [place] [address] [place] [address] Phone: [phone]  Fax: [phone]    Return to Endocrinology clinic as below: Future Appointments  Date Time Provider Department Center  02/15/2023  8:10 AM Jaclyn Carew, Konrad Dolores, MD [id number] None

## 2023-02-15 ENCOUNTER — Ambulatory Visit: Payer: BC Managed Care – PPO | Admitting: Internal Medicine

## 2023-03-07 DIAGNOSIS — E109 Type 1 diabetes mellitus without complications: Secondary | ICD-10-CM | POA: Diagnosis not present

## 2023-04-11 DIAGNOSIS — E109 Type 1 diabetes mellitus without complications: Secondary | ICD-10-CM | POA: Diagnosis not present

## 2023-04-13 ENCOUNTER — Encounter: Payer: Self-pay | Admitting: Internal Medicine

## 2023-04-21 DIAGNOSIS — J029 Acute pharyngitis, unspecified: Secondary | ICD-10-CM | POA: Diagnosis not present

## 2023-04-21 DIAGNOSIS — Z6830 Body mass index (BMI) 30.0-30.9, adult: Secondary | ICD-10-CM | POA: Diagnosis not present

## 2023-04-21 DIAGNOSIS — U071 COVID-19: Secondary | ICD-10-CM | POA: Diagnosis not present

## 2023-05-09 ENCOUNTER — Encounter: Payer: Self-pay | Admitting: Internal Medicine

## 2023-05-09 ENCOUNTER — Ambulatory Visit: Payer: BC Managed Care – PPO | Admitting: Internal Medicine

## 2023-05-09 VITALS — BP 118/82 | HR 83 | Ht 62.0 in | Wt 171.0 lb

## 2023-05-09 DIAGNOSIS — E1065 Type 1 diabetes mellitus with hyperglycemia: Secondary | ICD-10-CM | POA: Diagnosis not present

## 2023-05-09 DIAGNOSIS — Z794 Long term (current) use of insulin: Secondary | ICD-10-CM

## 2023-05-09 LAB — POCT GLYCOSYLATED HEMOGLOBIN (HGB A1C): Hemoglobin A1C: 8 % — AB (ref 4.0–5.6)

## 2023-05-09 MED ORDER — DEXCOM G7 SENSOR MISC: 1.0000 | 3 refills | Status: AC

## 2023-05-09 NOTE — Progress Notes (Unsigned)
Name: Robin Alvarez  MRN/ DOB: [id number], 1981/04/14   Age/ Sex: 42 y.o., female    PCP: Neldon Labella, PA   Reason for Endocrinology Evaluation: Type 1 Diabetes Mellitus     Date of Initial Endocrinology Visit: 11/12/2021    [name] is a 42 y.o. female with a past medical history of T1DM, Hx of ETOH dependence and depression. The patient presented for initial endocrinology clinic visit on 11/12/2021  for consultative assistance with her diabetes management.    HPI: [name] was    Diagnosed with DM at age 27 Prior Medications tried/Intolerance: Has been on the Medtronic for years until 2022 when she switched to Tandem  Hemoglobin A1c has ranged from 7.3% in 2021, peaking at 8.6% in 2022.   Last DKA 08/2020   Transitioned care from [facility]  On her initial visit to our clinic she had an A1c of 8.0%, we adjusted pump settings  SUBJECTIVE:   During the last visit (07/28/2022): A1c 6.3%   Today (05/09/23):Robin Alvarez is here for follow-up on diabetes management.  She checks her blood sugars multiple times daily. The patient has had hypoglycemic episodes since the last clinic visit, which typically occur in the morning. The patient is  symptomatic with these episodes.  Denies nausea, vomiting Denies constipation  or diarrhea    This patient with type 1 diabetes is treated with Tandem (insulin pump). During the visit the pump basal and bolus doses were reviewed including carb/insulin rations and supplemental doses. The clinical list was updated. The glucose meter download was reviewed in detail to determine if the current pump settings are providing the best glycemic control without excessive hypoglycemia.  Pump and meter download:    Pump   Tandem  Settings   Insulin type   Humalog    Basal rate       0000 1.5 u/h    0600 1.1 u/h    0900 1.50 u/h    1800 1.55  I:C ratio       0000 1:10   1800 1:10               Sensitivity       0000  62             Type & Model of Pump: Tandem  Insulin Type: Currently using Humalog .  Body mass index is 31.28 kg/m.  PUMP STATISTICS: Average BG: 160  Average Daily Carbs (g): 98  Average Total Daily Insulin: 52.93  Average Daily Basal: 40.28 (76 %) Average Daily Bolus: 12.65 (24 %)   CONTINUOUS GLUCOSE MONITORING RECORD INTERPRETATION    Dates of Recording: 12/10 - 05/09/2023  Sensor description: Dexcom G6  Results statistics:   CGM use % of time 94%  Average and SD 160/45  Time in range      67%  % Time Above 180 28  % Time above 250 3.4  % Time Below target 0.8   Glycemic patterns summary: BGs fluctuate throughout the day and night  Hyperglycemic episodes mostly postprandial  Hypoglycemic episodes occurred N/A  Overnight periods: Trends upwards     HOME DIABETES REGIMEN: Humalog    Statin: yes  ACE-I/ARB: no      DIABETIC COMPLICATIONS: Microvascular complications:   Denies: Neuropathy,retinopathy  Last eye exam: Completed 04/13/2023  Macrovascular complications:   Denies: CAD, PVD, CVA   PAST HISTORY: Past Medical History:  Past Medical History:  Diagnosis Date   Hyperlipidemia  Type I (juvenile type) diabetes mellitus with hyperosmolarity, not stated as uncontrolled    Past Surgical History:  Past Surgical History:  Procedure Laterality Date   ORIF ANKLE FRACTURE Right 03/13/2021   Procedure: OPEN REDUCTION INTERNAL FIXATION (ORIF) ANKLE FRACTURE;  Surgeon: Yolonda Kida, MD;  Location: [facility];  Service: Orthopedics;  Laterality: Right;    Social History:  reports that she quit smoking about 12 years ago. Her smoking use included cigarettes. She started smoking about 28 years ago. She has a 24 pack-year smoking history. She has quit using smokeless tobacco. She reports current alcohol use of about 20.0 standard drinks of alcohol per week. She reports current drug use. Frequency: 2.00 times per  week. Drug: Marijuana. Family History:  Family History  Problem Relation Age of Onset   Alcohol abuse Father    Alcohol abuse Sister      HOME MEDICATIONS: Allergies as of 05/09/2023       Reactions   Bactrim [sulfamethoxazole-trimethoprim] Hives        Medication List        Accurate as of May 09, 2023  9:46 AM. If you have any questions, ask your nurse or doctor.          STOP taking these medications    gabapentin 300 MG capsule Commonly known as: NEURONTIN Stopped by: Johnney Ou Truong Delcastillo       TAKE these medications    atorvastatin 20 MG tablet Commonly known as: LIPITOR Take 1 tablet (20 mg total) by mouth daily.   Dexcom G6 Sensor Misc INJECT 1 DEVICE INTO THE SKIN AS DIRECTED. REPLACE EVERY 10 DAYS   Dexcom G6 Transmitter Misc USE TO MONITOR BLOOD GLUCOSE CONTINUOUSLY AS DIRECTED   insulin lispro 100 UNIT/ML injection Commonly known as: HUMALOG Max daily 80 units   norethindrone-ethinyl estradiol 1/35 tablet Commonly known as: ORTHO-NOVUM Take 1 tablet by mouth daily.         ALLERGIES: Allergies  Allergen Reactions   Bactrim [Sulfamethoxazole-Trimethoprim] Hives         OBJECTIVE:   VITAL SIGNS: BP 118/82   Pulse 83   Ht 5\' 2"  (1.575 m)   Wt 171 lb (77.6 kg)   SpO2 96%   BMI 31.28 kg/m    PHYSICAL EXAM:  General: Pt appears well and is in NAD  Neck: General: Supple without adenopathy or carotid bruits. Thyroid: Thyroid size normal.  No goiter or nodules appreciated.   Lungs: Clear with good BS bilat with no rales, rhonchi, or wheezes  Heart: RRR   Abdomen: soft, nontender  Extremities:  Lower extremities - No pretibial edema. No lesions.  Neuro: MS is good with appropriate affect, pt is alert and Ox3    DM foot exam: 05/09/2023  The skin of the feet is intact without sores or ulcerations. The pedal pulses are 2+ on right and 2+ on left. The sensation is intact to a screening 5.07, 10 gram monofilament  bilaterally    DATA REVIEWED:  Lab Results  Component Value Date   HGBA1C 8.0 (A) 05/09/2023   HGBA1C 6.3 (A) 07/28/2022   HGBA1C 7.2 (A) 03/22/2022    Latest Reference Range & Units 05/09/23 10:22  Sodium 135 - 146 mmol/L 139  Potassium 3.5 - 5.3 mmol/L 4.1  Chloride 98 - 110 mmol/L 105  CO2 20 - 32 mmol/L 27  Glucose 65 - 99 mg/dL 161 (H)  BUN 7 - 25 mg/dL 10  Creatinine 0.96 - 0.45 mg/dL 4.09  Calcium 8.6 - 10.2 mg/dL 9.3  BUN/Creatinine Ratio 6 - 22 (calc) SEE NOTE:  Total CHOL/HDL Ratio <5.0 (calc) 3.3  Cholesterol <200 mg/dL 811 (H)  HDL Cholesterol > OR = 50 mg/dL 78  LDL Cholesterol (Calc) mg/dL (calc) 914 (H)  MICROALB/CREAT RATIO <30 mg/g creat 5  Non-HDL Cholesterol (Calc) <130 mg/dL (calc) 782 (H)  Triglycerides <150 mg/dL 76  (H): Data is abnormally high    ASSESSMENT / PLAN / RECOMMENDATIONS:   1) Type 2 Diabetes Mellitus, Poorly controlled, Without complications - Most recent A1c of 8.0  %. Goal A1c < 7.0 %.    -A1c has trended up from 6.3% to 8.0% -I have reviewed CGM/pump download, patient has been noted with variable hyperglycemia overnight and postprandial -I will increase her basal rate at midnight, I will also adjust her insulin to carb ratio and sensitivity factor as below -I have upgraded her to Dexcom G7 -BMP, MA/CR ratio, TFTs are normal   MEDICATIONS: Humalog   Pump   Tandem  Settings   Insulin type   Humalog    Basal rate       0000 1.6 u/h    0600 1.1 u/h    0900 1.5 u/h    1800 1.550  I:C ratio       0000 1:9   1800 1:9              Sensitivity       0000  55             EDUCATION / INSTRUCTIONS: BG monitoring instructions: Patient is instructed to check her blood sugars 3 times a day, before meals. Call [facility] if: BG persistently < 70  I reviewed the Rule of 15 for the treatment of hypoglycemia in detail with the patient. Literature supplied.   2) Diabetic complications:  Eye: Does not have  known diabetic retinopathy.  Neuro/ Feet: Does not have known diabetic peripheral neuropathy. Renal: Patient does not have known baseline CKD. She is not on an ACEI/ARB at present.   3)Dyslipidemia :   -LDL remains above goal -Patient with history of imperfect adherence to statin therapy -I will switch atorvastatin to rosuvastatin and encourage compliance   Medication Stop atorvastatin 20 mg daily Start rosuvastatin 20 mg daily     Follow-up in 6 months  Signed electronically by: Lyndle Herrlich, MD  [facility]  [facility] [address] [place] [place], [place] [address] Phone: [phone] FAX: [phone]   CC: [place], [place], [place] [address] [place] [address] Phone: [phone]  Fax: [phone]    Return to Endocrinology clinic as below: No future appointments.

## 2023-05-09 NOTE — Patient Instructions (Signed)

## 2023-05-10 LAB — LIPID PANEL
Cholesterol: 261 mg/dL — ABNORMAL HIGH (ref <200)
HDL: 78 mg/dL (ref >=50)
LDL Cholesterol (Calc): 165 mg/dL — ABNORMAL HIGH
Non-HDL Cholesterol (Calc): 183 mg/dL — ABNORMAL HIGH (ref <130)
Total CHOL/HDL Ratio: 3.3 (calc) (ref <5.0)
Triglycerides: 76 mg/dL (ref <150)

## 2023-05-10 LAB — T4, FREE: Free T4: 1 ng/dL (ref 0.8–1.8)

## 2023-05-10 LAB — BASIC METABOLIC PANEL
BUN: 10 mg/dL (ref 7–25)
CO2: 27 mmol/L (ref 20–32)
Calcium: 9.3 mg/dL (ref 8.6–10.2)
Chloride: 105 mmol/L (ref 98–110)
Creat: 0.7 mg/dL (ref 0.50–0.99)
Glucose, Bld: 205 mg/dL — ABNORMAL HIGH (ref 65–99)
Potassium: 4.1 mmol/L (ref 3.5–5.3)
Sodium: 139 mmol/L (ref 135–146)

## 2023-05-10 LAB — MICROALBUMIN / CREATININE URINE RATIO
Creatinine, Urine: 40 mg/dL (ref 20–275)
Microalb Creat Ratio: 5 mg/g{creat} (ref <30)
Microalb, Ur: 0.2 mg/dL

## 2023-05-10 LAB — TSH: TSH: 0.62 m[IU]/L

## 2023-05-12 MED ORDER — ROSUVASTATIN CALCIUM 20 MG PO TABS: 20.0000 mg | ORAL_TABLET | Freq: Every day | ORAL | 3 refills | Status: DC

## 2023-05-16 DIAGNOSIS — E109 Type 1 diabetes mellitus without complications: Secondary | ICD-10-CM | POA: Diagnosis not present

## 2023-05-19 ENCOUNTER — Encounter: Payer: Self-pay | Admitting: Internal Medicine

## 2023-08-08 DIAGNOSIS — R509 Fever, unspecified: Secondary | ICD-10-CM | POA: Diagnosis not present

## 2023-08-08 DIAGNOSIS — J09X2 Influenza due to identified novel influenza A virus with other respiratory manifestations: Secondary | ICD-10-CM | POA: Diagnosis not present

## 2023-08-08 DIAGNOSIS — R030 Elevated blood-pressure reading, without diagnosis of hypertension: Secondary | ICD-10-CM | POA: Diagnosis not present

## 2023-08-08 DIAGNOSIS — E1065 Type 1 diabetes mellitus with hyperglycemia: Secondary | ICD-10-CM | POA: Diagnosis not present

## 2023-08-14 ENCOUNTER — Other Ambulatory Visit: Payer: Self-pay | Admitting: Internal Medicine

## 2023-08-14 DIAGNOSIS — E1065 Type 1 diabetes mellitus with hyperglycemia: Secondary | ICD-10-CM

## 2023-08-16 DIAGNOSIS — E109 Type 1 diabetes mellitus without complications: Secondary | ICD-10-CM | POA: Diagnosis not present

## 2023-08-22 ENCOUNTER — Other Ambulatory Visit (HOSPITAL_COMMUNITY): Payer: Self-pay

## 2023-08-28 IMAGING — RF DG ANKLE COMPLETE 3+V*R*
1 series · 3 of 3 positions shown · non-contrast
Comparison: CT 03/10/2021

CLINICAL DATA: Ankle surgery

EXAM:
RIGHT ANKLE - COMPLETE 3+ VIEW

[Series 1: unknown protocol · 0.20mm/px · 3 of 3 slices shown]
[im 1/3]
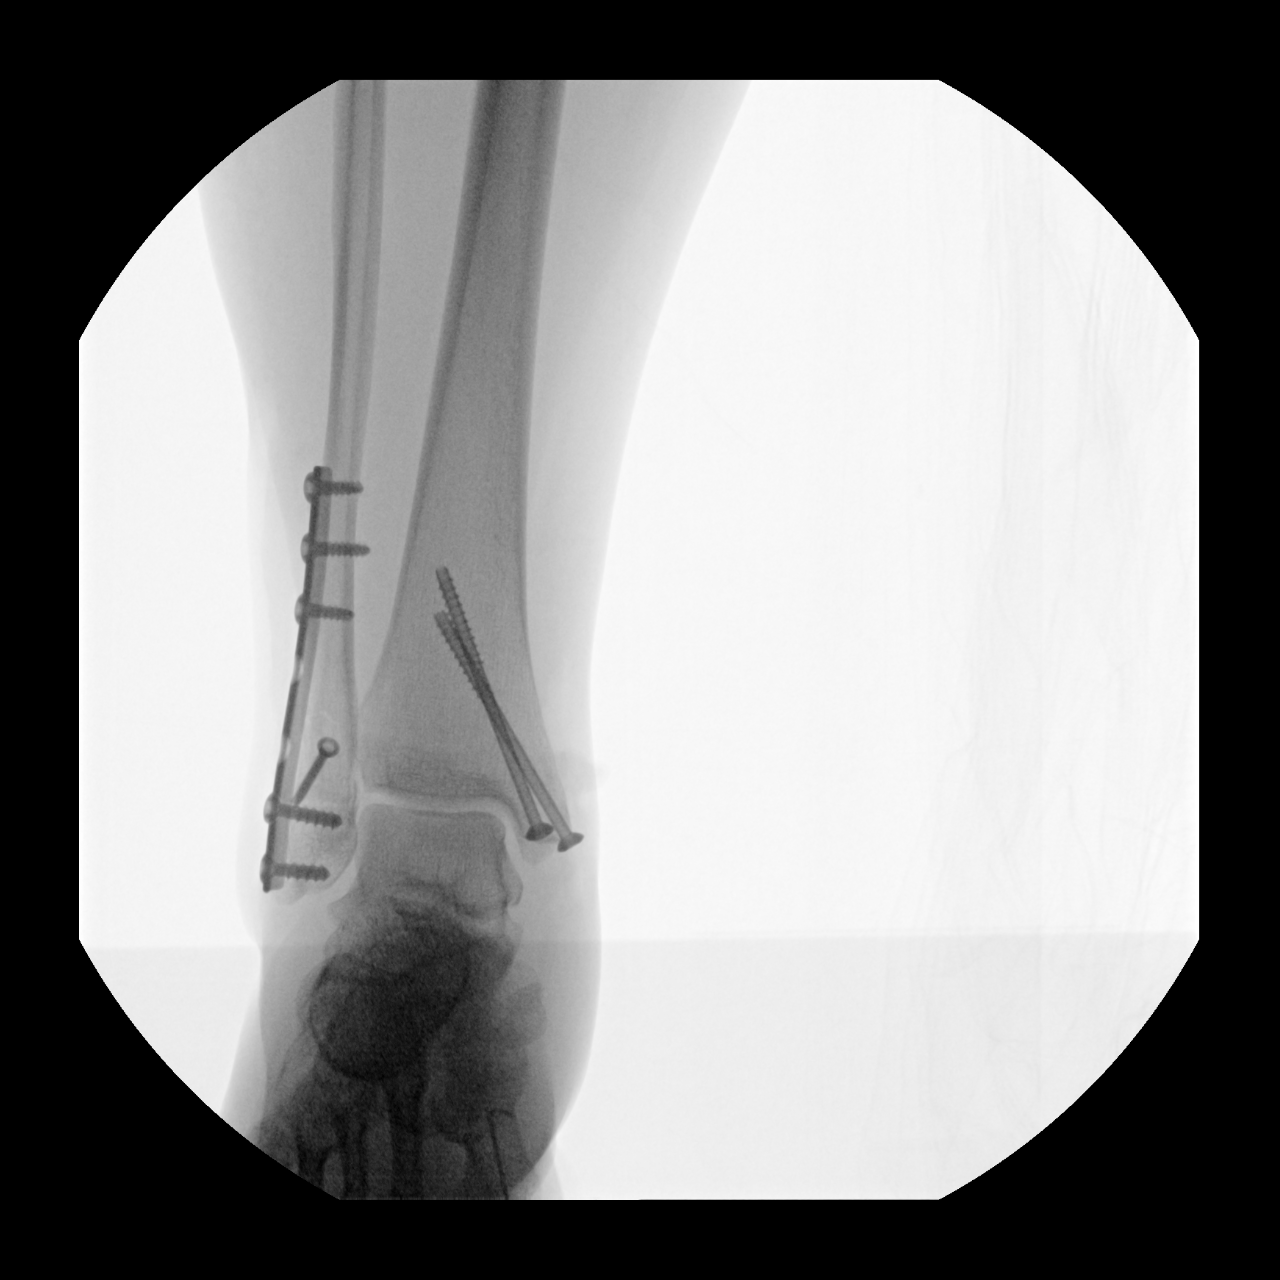
[im 2/3]
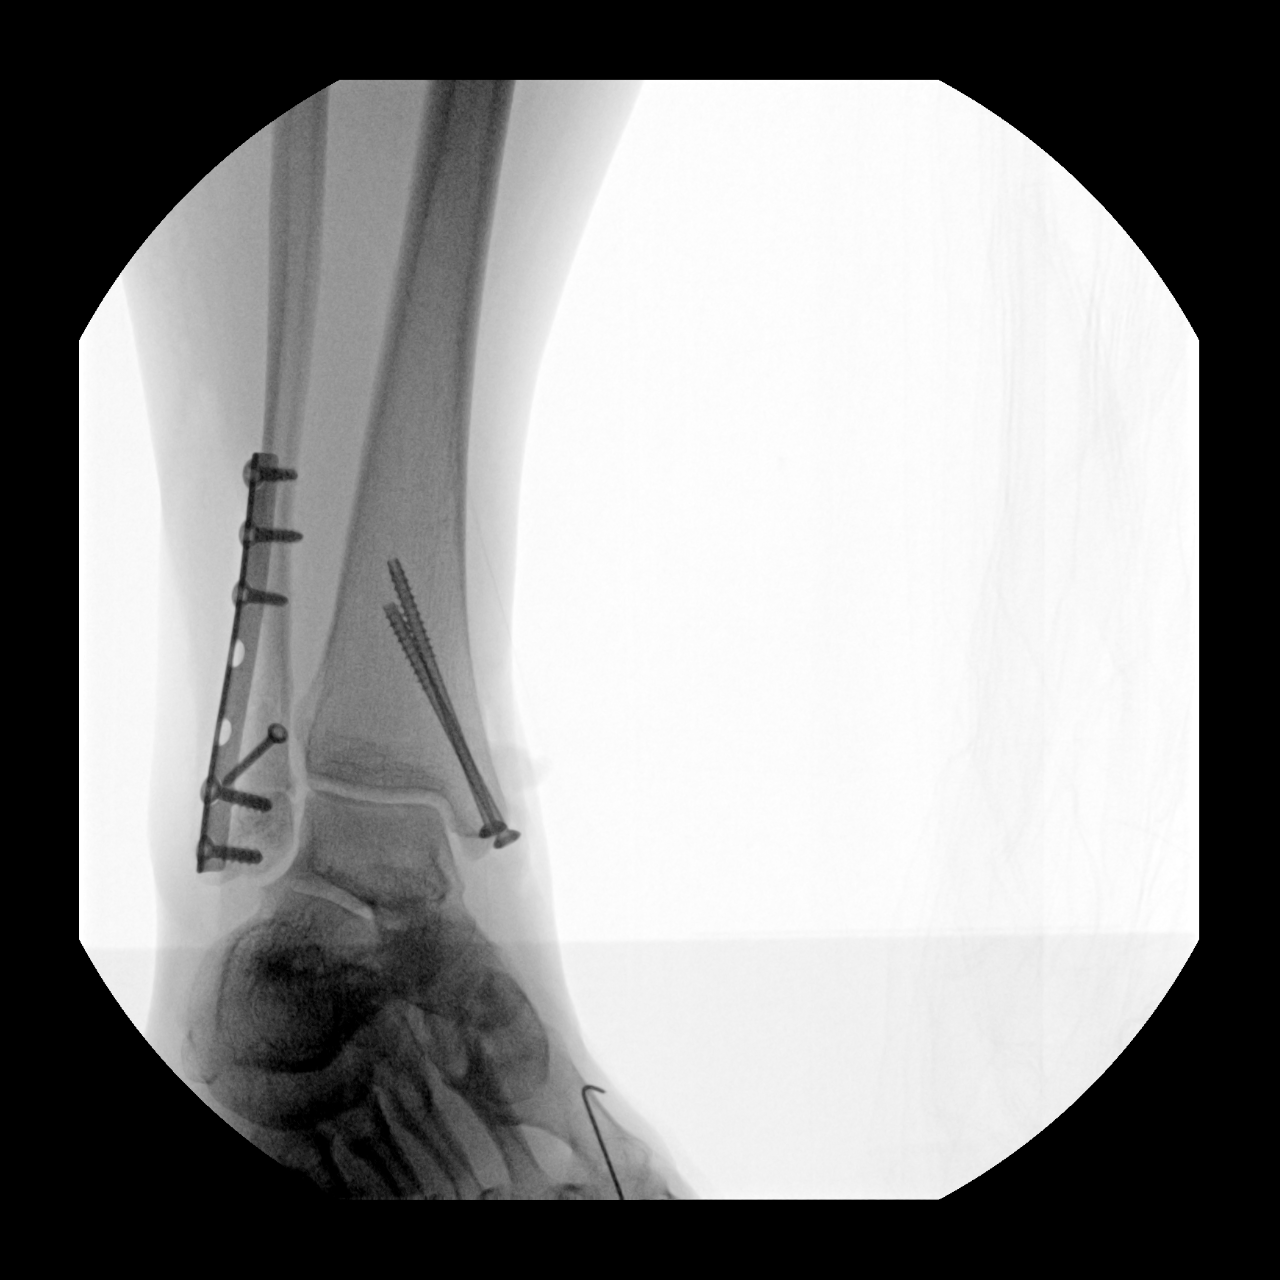
[im 3/3]
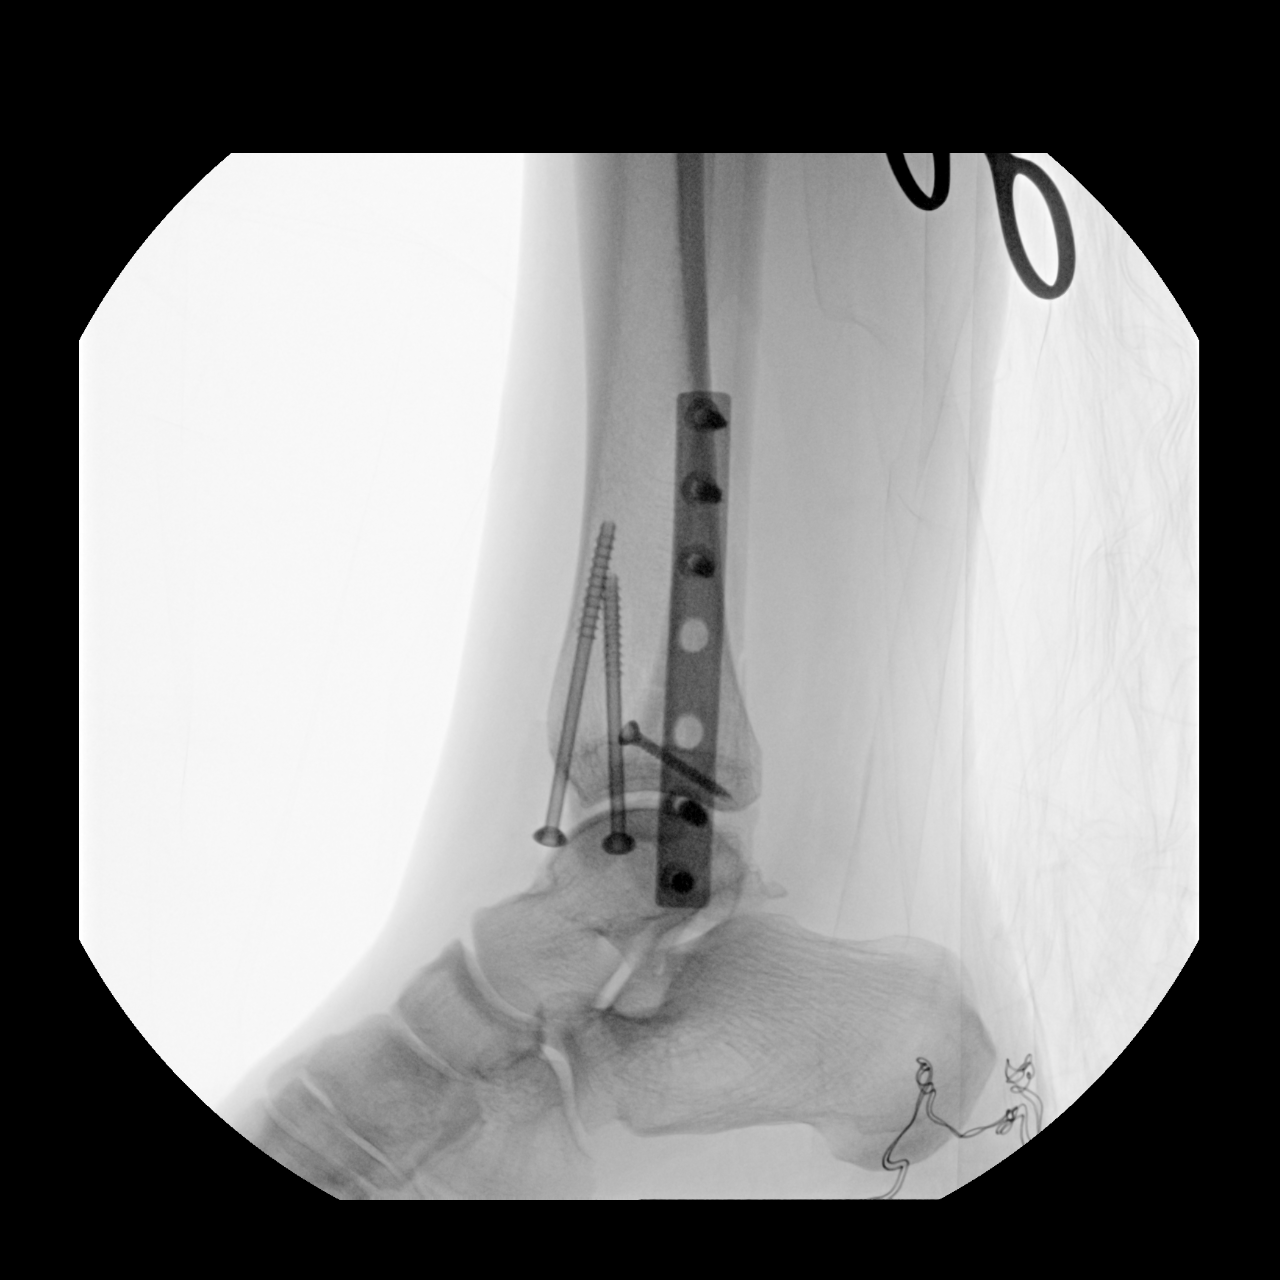

[3 of 3 positions shown; findings below may reference images not displayed]

FINDINGS: Three low resolution intraoperative spot views of the right ankle.
Total fluoroscopy time was 11 seconds. The images demonstrate
surgical plate and screw fixation of the distal fibula for distal
fibular fracture. Screw fixation of medial malleolar fracture.
Reduction of previously noted ankle dislocation. Anatomic alignment
on the images submitted. Additional partially visualized fixating
wire at the first metatarsal
IMPRESSION: Intraoperative fluoroscopic assistance provided during surgical
fixation of ankle fractures

## 2023-09-12 ENCOUNTER — Ambulatory Visit: Payer: BC Managed Care – PPO | Admitting: Internal Medicine

## 2023-09-29 DIAGNOSIS — E109 Type 1 diabetes mellitus without complications: Secondary | ICD-10-CM | POA: Diagnosis not present

## 2023-11-03 ENCOUNTER — Encounter: Payer: Self-pay | Admitting: Internal Medicine

## 2023-11-03 ENCOUNTER — Ambulatory Visit: Admitting: Internal Medicine

## 2023-11-03 ENCOUNTER — Other Ambulatory Visit (HOSPITAL_COMMUNITY): Payer: Self-pay

## 2023-11-03 VITALS — BP 124/70 | HR 90 | Ht 62.0 in | Wt 181.0 lb

## 2023-11-03 DIAGNOSIS — E785 Hyperlipidemia, unspecified: Secondary | ICD-10-CM | POA: Diagnosis not present

## 2023-11-03 DIAGNOSIS — E109 Type 1 diabetes mellitus without complications: Secondary | ICD-10-CM

## 2023-11-03 DIAGNOSIS — E1065 Type 1 diabetes mellitus with hyperglycemia: Secondary | ICD-10-CM

## 2023-11-03 DIAGNOSIS — R635 Abnormal weight gain: Secondary | ICD-10-CM

## 2023-11-03 LAB — POCT GLYCOSYLATED HEMOGLOBIN (HGB A1C): Hemoglobin A1C: 7 % — AB (ref 4.0–5.6)

## 2023-11-03 MED ORDER — TIRZEPATIDE 2.5 MG/0.5ML ~~LOC~~ SOAJ: 2.5000 mg | SUBCUTANEOUS | 0 refills | Status: DC

## 2023-11-03 MED ORDER — TIRZEPATIDE 5 MG/0.5ML ~~LOC~~ SOAJ: 5.0000 mg | SUBCUTANEOUS | 3 refills | Status: DC

## 2023-11-03 NOTE — Progress Notes (Signed)
 Name: Robin Alvarez  MRN/ DOB: [id number], 11-24-1980   Age/ Sex: 43 y.o., female    PCP: Shelly Diamond, PA   Reason for Endocrinology Evaluation: Type 1 Diabetes Mellitus     Date of Initial Endocrinology Visit: 11/12/2021    [name] is a 43 y.o. female with a past medical history of T1DM, Hx of ETOH dependence and depression. The patient presented for initial endocrinology clinic visit on 11/12/2021  for consultative assistance with her diabetes management.    HPI: Ms. Veith was    Diagnosed with DM at age 33 Prior Medications tried/Intolerance: Has been on the Medtronic for years until 2022 when she switched to Tandem  Hemoglobin A1c has ranged from 7.3% in 2021, peaking at 8.6% in 2022.  Last DKA 08/2020   Transitioned care from [facility]  On her initial visit to our clinic she had an A1c of 8.0%, we adjusted pump settings  SUBJECTIVE:   During the last visit (05/09/2023): A1c 8.0%   Today (11/03/23):Robin Alvarez is here for follow-up on diabetes management.  She checks her blood sugars multiple times daily. The patient has had hypoglycemic episodes since the last clinic visit, which typically occur in the morning. The patient is  symptomatic with these episodes.  She has noted 10 lbs weight gain  Denies nausea, vomiting Denies constipation  or diarrhea    This patient with type 1 diabetes is treated with Tandem (insulin  pump). During the visit the pump basal and bolus doses were reviewed including carb/insulin  rations and supplemental doses. The clinical list was updated. The glucose meter download was reviewed in detail to determine if the current pump settings are providing the best glycemic control without excessive hypoglycemia.  Pump and meter download:    Pump   Tandem  Settings   Insulin  type   Humalog     Basal rate       0000 1.6 u/h    0600 1.1 u/h    0900 1.5 u/h    1800 1.550  I:C ratio       0000 1:10    0600 1:10   0900 1:9   1800 1:9          Sensitivity       0000  55               Type & Model of Pump: Tandem  Insulin  Type: Currently using Humalog  .  Body mass index is 33.11 kg/m.  PUMP STATISTICS: Average BG: 182 Average Daily Carbs (g): 160 Average Total Daily Insulin : 66.5 Average Daily Basal: 42.15(63 %) Average Daily Bolus: 24.34 (37%)   CONTINUOUS GLUCOSE MONITORING RECORD INTERPRETATION    Dates of Recording: 6/6-6/19/2025  Sensor description: Dexcom   Results statistics:   CGM use % of time 99%  Average and SD 182/59  Time in range  52%  % Time Above 180 39  % Time above 250 9  % Time Below target 0.5   Glycemic patterns summary: BGs fluctuate throughout the day and night  Hyperglycemic episodes mostly postprandial  Hypoglycemic episodes occurred N/A  Overnight periods: Trends upwards     HOME DIABETES REGIMEN: Humalog     Statin: yes  ACE-I/ARB: no      DIABETIC COMPLICATIONS: Microvascular complications:   Denies: Neuropathy,retinopathy  Last eye exam: Completed 04/13/2023  Macrovascular complications:   Denies: CAD, PVD, CVA   PAST HISTORY: Past Medical History:  Past Medical History:  Diagnosis Date  Hyperlipidemia    Type I (juvenile type) diabetes mellitus with hyperosmolarity, not stated as uncontrolled    Past Surgical History:  Past Surgical History:  Procedure Laterality Date   ORIF ANKLE FRACTURE Right 03/13/2021   Procedure: OPEN REDUCTION INTERNAL FIXATION (ORIF) ANKLE FRACTURE;  Surgeon: Janeth Medicus, MD;  Location: [facility];  Service: Orthopedics;  Laterality: Right;    Social History:  reports that she quit smoking about 13 years ago. Her smoking use included cigarettes. She started smoking about 29 years ago. She has a 24 pack-year smoking history. She has quit using smokeless tobacco. She reports current alcohol  use of about 20.0 standard drinks of alcohol  per week. She reports current  drug use. Frequency: 2.00 times per week. Drug: Marijuana. Family History:  Family History  Problem Relation Age of Onset   Alcohol  abuse Father    Alcohol  abuse Sister      HOME MEDICATIONS: Allergies as of 11/03/2023       Reactions   Bactrim [sulfamethoxazole-trimethoprim] Hives        Medication List        Accurate as of November 03, 2023  9:04 AM. If you have any questions, ask your nurse or doctor.          STOP taking these medications    norethindrone -ethinyl estradiol  1/35 tablet Commonly known as: ORTHO-NOVUM Stopped by: Camilla Cedar Jerimiah Wolman       TAKE these medications    Dexcom G7 Sensor Misc 1 Device by Does not apply route as directed.   insulin  lispro 100 UNIT/ML injection Commonly known as: HUMALOG  INJECT 80 UNITS MAX DAILY.   rosuvastatin  20 MG tablet Commonly known as: Crestor  Take 1 tablet (20 mg total) by mouth daily.   tirzepatide 2.5 MG/0.5ML Pen Commonly known as: MOUNJARO Inject 2.5 mg into the skin once a week. Started by: Malynda Smolinski J Chanze Teagle   tirzepatide 5 MG/0.5ML Pen Commonly known as: MOUNJARO Inject 5 mg into the skin once a week. Started by: Camilla Cedar Issaih Kaus         ALLERGIES: Allergies  Allergen Reactions   Bactrim [Sulfamethoxazole-Trimethoprim] Hives         OBJECTIVE:   VITAL SIGNS: BP 124/70 (BP Location: Left Arm, Patient Position: Sitting, Cuff Size: Normal)   Pulse 90   Ht 5' 2 (1.575 m)   Wt 181 lb (82.1 kg)   SpO2 99%   BMI 33.11 kg/m    PHYSICAL EXAM:  General: Pt appears well and is in NAD  Neck: General: Supple without adenopathy or carotid bruits. Thyroid : Thyroid  size normal.  No goiter or nodules appreciated.   Lungs: Clear with good BS bilat with no rales, rhonchi, or wheezes  Heart: RRR   Abdomen: soft, nontender  Extremities:  Lower extremities - No pretibial edema. No lesions.  Neuro: MS is good with appropriate affect, pt is alert and Ox3    DM foot exam:  11/03/2023  The skin of the feet is intact without sores or ulcerations. The pedal pulses are 2+ on right and 2+ on left. The sensation is intact to a screening 5.07, 10 gram monofilament bilaterally    DATA REVIEWED:  Lab Results  Component Value Date   HGBA1C 7.0 (A) 11/03/2023   HGBA1C 8.0 (A) 05/09/2023   HGBA1C 6.3 (A) 07/28/2022    Latest Reference Range & Units 05/09/23 10:22  Sodium 135 - 146 mmol/L 139  Potassium 3.5 - 5.3 mmol/L 4.1  Chloride 98 - 110 mmol/L 105  CO2 20 - 32 mmol/L 27  Glucose 65 - 99 mg/dL 161 (H)  BUN 7 - 25 mg/dL 10  Creatinine 0.96 - 0.45 mg/dL 4.09  Calcium  8.6 - 10.2 mg/dL 9.3  BUN/Creatinine Ratio 6 - 22 (calc) SEE NOTE:  Total CHOL/HDL Ratio <5.0 (calc) 3.3  Cholesterol <200 mg/dL 811 (H)  HDL Cholesterol > OR = 50 mg/dL 78  LDL Cholesterol (Calc) mg/dL (calc) 914 (H)  MICROALB/CREAT RATIO <30 mg/g creat 5  Non-HDL Cholesterol (Calc) <130 mg/dL (calc) 782 (H)  Triglycerides <150 mg/dL 76    ASSESSMENT / PLAN / RECOMMENDATIONS:   1) Type 2 Diabetes Mellitus, Poorly  controlled, Without complications - Most recent A1c of 7.0  %. Goal A1c < 7.0 %.    -A1c has trended up from 6.3% to 8.0% -I have reviewed CGM/pump download, patient has been noted with variable hyperglycemia overnight and postprandial -I will increase her basal rate at midnight, I will also adjust her insulin  to carb ratio and sensitivity factor as below -I have upgraded her to Dexcom G7  MEDICATIONS: Humalog    Pump   Tandem  Settings   Insulin  type   Humalog     Basal rate       0000 1.7 u/h    0600 1.1 u/h    0900 1.6 u/h    1800 1.550  I:C ratio       0000 1:   1800               Sensitivity       0000  55             EDUCATION / INSTRUCTIONS: BG monitoring instructions: Patient is instructed to check her blood sugars 3 times a day, before meals. Call [facility] if: BG persistently < 70  I reviewed the Rule of 15 for the treatment  of hypoglycemia in detail with the patient. Literature supplied.   2) Diabetic complications:  Eye: Does not have known diabetic retinopathy.  Neuro/ Feet: Does not have known diabetic peripheral neuropathy. Renal: Patient does not have known baseline CKD. She is not on an ACEI/ARB at present.   3)Dyslipidemia :   - I have switched rosuvastatin  to atorvastatin  in December, 2024 due to elevated LDL.  Medication  rosuvastatin  20 mg daily  4) Weight Gain:   Follow-up in 6 months  Signed electronically by: Natale Bail, MD  [facility] End[facility]  [facility] [address] [place], [place] [address] Phone: [phone] FAX: [phone]   CC: [place], [place], [place] [address] [place] [address] Phone: [phone]  Fax: [phone]    Return to Endocrinology clinic as below: Future Appointments  Date Time Provider Department Center  01/31/2024  8:10 AM Alpha Mysliwiec, Julian Obey, MD [facility] None

## 2023-11-03 NOTE — Patient Instructions (Signed)
 Start Mounjaro 2.5 mg weekly for 4 weeks, then increase to 5 mg weekly   -HOW TO TREAT LOW BLOOD SUGARS (Blood sugar LESS THAN 70 MG/DL) Please follow the RULE OF 15 for the treatment of hypoglycemia treatment (when your (blood sugars are less than 70 mg/dL)   STEP 1: Take 15 grams of carbohydrates when your blood sugar is low, which includes:  3-4 GLUCOSE TABS  OR 3-4 OZ OF JUICE OR REGULAR SODA OR ONE TUBE OF GLUCOSE GEL    STEP 2: RECHECK blood sugar in 15 MINUTES STEP 3: If your blood sugar is still low at the 15 minute recheck --> then, go back to STEP 1 and treat AGAIN with another 15 grams of carbohydrates.

## 2023-11-04 ENCOUNTER — Ambulatory Visit: Payer: Self-pay | Admitting: Internal Medicine

## 2023-11-04 DIAGNOSIS — E785 Hyperlipidemia, unspecified: Secondary | ICD-10-CM | POA: Insufficient documentation

## 2023-11-04 DIAGNOSIS — R635 Abnormal weight gain: Secondary | ICD-10-CM | POA: Insufficient documentation

## 2023-11-04 LAB — TSH: TSH: 1.4 m[IU]/L

## 2023-11-04 LAB — T4, FREE: Free T4: 1.2 ng/dL (ref 0.8–1.8)

## 2023-11-08 ENCOUNTER — Other Ambulatory Visit (HOSPITAL_COMMUNITY): Payer: Self-pay

## 2023-11-08 ENCOUNTER — Telehealth: Payer: Self-pay | Admitting: Pharmacy Technician

## 2023-11-08 DIAGNOSIS — E66811 Obesity, class 1: Secondary | ICD-10-CM

## 2023-11-08 NOTE — Telephone Encounter (Addendum)
 Pharmacy Patient Advocate Encounter   Received notification from CoverMyMeds that prior authorization for Mounjaro  2.5MG /0.5ML auto-injectors is required/requested.   Insurance verification completed.   The patient is insured through [employer] .  Key: [id number]   Prior Authorization form/request asks a question that requires your assistance. Please see the question below and advise accordingly. The PA will not be submitted until the necessary information is received.   **Has she tried metformin before? I am needing chart notes stating that she has tried it or clinical rationale stating why she cannot try metformin to avoid denial.**

## 2023-11-08 NOTE — Telephone Encounter (Signed)
 Yes! They will not ask if the patient has tried metformin for that. Please attach the diagnosis code that you would like for me to use on the patients chart somewhere or on the Rx when you change it and I will try for Zepbound !

## 2023-11-09 ENCOUNTER — Telehealth: Payer: Self-pay | Admitting: Pharmacy Technician

## 2023-11-09 ENCOUNTER — Other Ambulatory Visit (HOSPITAL_COMMUNITY): Payer: Self-pay

## 2023-11-09 ENCOUNTER — Encounter (INDEPENDENT_AMBULATORY_CARE_PROVIDER_SITE_OTHER): Payer: Self-pay | Admitting: Pharmacy Technician

## 2023-11-09 MED ORDER — TIRZEPATIDE-WEIGHT MANAGEMENT 2.5 MG/0.5ML ~~LOC~~ SOLN
2.5000 mg | SUBCUTANEOUS | 0 refills | Status: DC
Start: 2023-11-09 — End: 2024-01-31

## 2023-11-09 MED ORDER — TIRZEPATIDE-WEIGHT MANAGEMENT 5 MG/0.5ML ~~LOC~~ SOLN: 5.0000 mg | SUBCUTANEOUS | 3 refills | Status: DC

## 2023-11-09 NOTE — Telephone Encounter (Addendum)
 Pharmacy Patient Advocate Encounter  Received notification from [employer] that Prior Authorization for Zepbound  2.5MG /0.5ML pen-injectorshas been CANCELLED due to **The insurance wouldn't let me complete the PA due to the fact that it is completely excluded from their plan. Key: [id number]     **Tried out Birch.Brandt**  Received notification from [employer] Messages that prior authorization for Wegovy 0.25MG /0.5ML auto-injectors is required/requested.  Received notification from [employer] that Prior Authorization for [facility] 0.25MG /0.5ML auto-injectors has been DENIED.  Full denial letter will be uploaded to the media tab. See denial reason below.     PA #/Case ID/Reference #: [id number]

## 2023-11-09 NOTE — Telephone Encounter (Addendum)
 error

## 2023-11-14 ENCOUNTER — Other Ambulatory Visit (HOSPITAL_COMMUNITY): Payer: Self-pay

## 2023-11-14 DIAGNOSIS — E109 Type 1 diabetes mellitus without complications: Secondary | ICD-10-CM | POA: Diagnosis not present

## 2024-01-05 DIAGNOSIS — R059 Cough, unspecified: Secondary | ICD-10-CM | POA: Diagnosis not present

## 2024-01-05 DIAGNOSIS — J329 Chronic sinusitis, unspecified: Secondary | ICD-10-CM | POA: Diagnosis not present

## 2024-01-05 DIAGNOSIS — E109 Type 1 diabetes mellitus without complications: Secondary | ICD-10-CM | POA: Diagnosis not present

## 2024-01-05 DIAGNOSIS — E6609 Other obesity due to excess calories: Secondary | ICD-10-CM | POA: Diagnosis not present

## 2024-01-05 DIAGNOSIS — R030 Elevated blood-pressure reading, without diagnosis of hypertension: Secondary | ICD-10-CM | POA: Diagnosis not present

## 2024-01-05 DIAGNOSIS — Z6831 Body mass index (BMI) 31.0-31.9, adult: Secondary | ICD-10-CM | POA: Diagnosis not present

## 2024-01-05 DIAGNOSIS — B9689 Other specified bacterial agents as the cause of diseases classified elsewhere: Secondary | ICD-10-CM | POA: Diagnosis not present

## 2024-01-31 ENCOUNTER — Ambulatory Visit: Admitting: Internal Medicine

## 2024-01-31 ENCOUNTER — Encounter: Payer: Self-pay | Admitting: Internal Medicine

## 2024-01-31 VITALS — BP 124/80 | HR 67 | Ht 62.0 in | Wt 172.2 lb

## 2024-01-31 DIAGNOSIS — E785 Hyperlipidemia, unspecified: Secondary | ICD-10-CM

## 2024-01-31 DIAGNOSIS — E109 Type 1 diabetes mellitus without complications: Secondary | ICD-10-CM

## 2024-01-31 LAB — POCT GLYCOSYLATED HEMOGLOBIN (HGB A1C): Hemoglobin A1C: 7 % — AB (ref 4.0–5.6)

## 2024-01-31 NOTE — Patient Instructions (Signed)

## 2024-01-31 NOTE — Progress Notes (Unsigned)
 Name: Robin Alvarez  MRN/ DOB: [id number], [birth year]   Age/ Sex: 43 y.o., female    PCP: Allyson Blackbird, PA   Reason for Endocrinology Evaluation: Type 1 Diabetes Mellitus     Date of Initial Endocrinology Visit: 11/12/2021    [name] is a 43 y.o. female with a past medical history of T1DM, Hx of ETOH dependence and depression. The patient presented for initial endocrinology clinic visit on 11/12/2021  for consultative assistance with her diabetes management.    HPI: Robin Alvarez was    Diagnosed with DM at age 73 Prior Medications tried/Intolerance: Has been on the Medtronic for years until 2022 when she switched to Tandem  Hemoglobin A1c has ranged from 7.3% in 2021, peaking at 8.6% in 2022.  Last DKA 08/2020   Transitioned care from [facility]  On her initial visit to our clinic she had an A1c of 8.0%, we adjusted pump settings  SUBJECTIVE:   During the last visit (11/03/2023): A1c 7.0%   Today (01/31/24):Robin Alvarez is here for follow-up on diabetes management.  She checks her blood sugars multiple times daily. The patient has had hypoglycemic episodes since the last clinic visit, which typically occur in the morning. The patient is  symptomatic with these episodes.  Weight management has been excluded from a plan, Zepbound  was not covered Pt has been noted with weight loss since her last visit here   Denies nausea, vomiting Denies constipation  or diarrhea    This patient with type 1 diabetes is treated with Tandem (insulin  pump). During the visit the pump basal and bolus doses were reviewed including carb/insulin  rations and supplemental doses. The clinical list was updated. The glucose meter download was reviewed in detail to determine if the current pump settings are providing the best glycemic control without excessive hypoglycemia.  Pump and meter download:    Pump   Tandem  Settings   Insulin  type   Humalog      Basal rate       0000 1.7 u/h    0600 1.1 u/h    0900 1.6 u/h    1800 1.550  I:C ratio       0000 1:10   0600 1:10   0900 1:9   1800 1:9      Sensitivity       0000  55               Type & Model of Pump: Tandem  Insulin  Type: Currently using Humalog  .  Body mass index is 31.5 kg/m.  PUMP STATISTICS: Average BG: 162 Average Daily Carbs (g): 130 Average Total Daily Insulin : 58.48 Average Daily Basal: 38.79(66 %) Average Daily Bolus: 19.68 (34%)   CONTINUOUS GLUCOSE MONITORING RECORD INTERPRETATION    Dates of Recording: 9/3-9/16/2025  Sensor description: Dexcom   Results statistics:   CGM use % of time 98%  Average and SD 164/53  Time in range 63%  % Time Above 180 28  % Time above 250 7.2  % Time Below target 1.4   Glycemic patterns summary:Bg's fluctuate during the day and night   Hyperglycemic episodes mostly postprandial  Hypoglycemic episodes occurred rare   Overnight periods:  variable      HOME DIABETES REGIMEN: Humalog     Statin: yes  ACE-I/ARB: no      DIABETIC COMPLICATIONS: Microvascular complications:   Denies: Neuropathy,retinopathy  Last eye exam: Completed 04/13/2023  Macrovascular complications:   Denies: CAD, PVD, CVA  PAST HISTORY: Past Medical History:  Past Medical History:  Diagnosis Date   Hyperlipidemia    Type I (juvenile type) diabetes mellitus with hyperosmolarity, not stated as uncontrolled    Past Surgical History:  Past Surgical History:  Procedure Laterality Date   ORIF ANKLE FRACTURE Right 03/13/2021   Procedure: OPEN REDUCTION INTERNAL FIXATION (ORIF) ANKLE FRACTURE;  Surgeon: Sharl Selinda Dover, MD;  Location: [facility];  Service: Orthopedics;  Laterality: Right;    Social History:  reports that she quit smoking about 13 years ago. Her smoking use included cigarettes. She started smoking about 29 years ago. She has a 24 pack-year smoking history. She has quit using smokeless tobacco. She  reports current alcohol  use of about 20.0 standard drinks of alcohol  per week. She reports current drug use. Frequency: 2.00 times per week. Drug: Marijuana. Family History:  Family History  Problem Relation Age of Onset   Alcohol  abuse Father    Alcohol  abuse Sister      HOME MEDICATIONS: Allergies as of 01/31/2024       Reactions   Bactrim [sulfamethoxazole-trimethoprim] Hives        Medication List        Accurate as of January 31, 2024  8:15 AM. If you have any questions, ask your nurse or doctor.          Dexcom G7 Sensor Misc 1 Device by Does not apply route as directed.   insulin  lispro 100 UNIT/ML injection Commonly known as: HUMALOG  INJECT 80 UNITS MAX DAILY.   rosuvastatin  20 MG tablet Commonly known as: Crestor  Take 1 tablet (20 mg total) by mouth daily.   tirzepatide  2.5 MG/0.5ML injection vial Commonly known as: ZEPBOUND  Inject 2.5 mg into the skin once a week.   tirzepatide  5 MG/0.5ML injection vial Inject 5 mg into the skin once a week. To follow the 2.5 mg dose         ALLERGIES: Allergies  Allergen Reactions   Bactrim [Sulfamethoxazole-Trimethoprim] Hives         OBJECTIVE:   VITAL SIGNS: BP 124/80 (BP Location: Left Arm, Patient Position: Sitting, Cuff Size: Normal)   Pulse 67   Ht 5' 2 (1.575 m)   Wt 172 lb 3.2 oz (78.1 kg)   SpO2 99%   BMI 31.50 kg/m      Filed Weights   01/31/24 0807  Weight: 172 lb 3.2 oz (78.1 kg)    PHYSICAL EXAM:  General: Pt appears well and is in NAD  Neck: General: Supple without adenopathy or carotid bruits. Thyroid : Thyroid  size normal.  No goiter or nodules appreciated.   Lungs: Clear with good BS bilat with no rales, rhonchi, or wheezes  Heart: RRR   Abdomen: soft, nontender  Extremities:  Lower extremities - No pretibial edema. No lesions.  Neuro: MS is good with appropriate affect, pt is alert and Ox3    DM foot exam: 11/03/2023  The skin of the feet is intact without sores  or ulcerations. The pedal pulses are 2+ on right and 2+ on left. The sensation is intact to a screening 5.07, 10 gram monofilament bilaterally    DATA REVIEWED:  Lab Results  Component Value Date   HGBA1C 7.0 (A) 01/31/2024   HGBA1C 7.0 (A) 11/03/2023   HGBA1C 8.0 (A) 05/09/2023    Latest Reference Range & Units 01/31/24 08:38  Sodium 135 - 146 mmol/L 139  Potassium 3.5 - 5.3 mmol/L 4.2  Chloride 98 - 110 mmol/L 104  CO2 20 -  32 mmol/L 30  Glucose 65 - 99 mg/dL 869 (H)  BUN 7 - 25 mg/dL 8  Creatinine 9.49 - 9.00 mg/dL 9.29  Calcium  8.6 - 10.2 mg/dL 9.5  BUN/Creatinine Ratio 6 - 22 (calc) SEE NOTE:  eGFR > OR = 60 mL/min/1.64m2 109    Latest Reference Range & Units 01/31/24 08:38  Total CHOL/HDL Ratio <5.0 (calc) 2.6  Cholesterol <200 mg/dL 835  HDL Cholesterol > OR = 50 mg/dL 63  LDL Cholesterol (Calc) mg/dL (calc) 85  MICROALB/CREAT RATIO <30 mg/g creat NOTE  Non-HDL Cholesterol (Calc) <130 mg/dL (calc) 898  Triglycerides <150 mg/dL 73    Latest Reference Range & Units 01/31/24 08:38  TSH mIU/L 2.12    Latest Reference Range & Units 01/31/24 08:38  Microalb, Ur mg/dL <9.7  MICROALB/CREAT RATIO <30 mg/g creat NOTE  Creatinine, Urine 20 - 275 mg/dL 32    ASSESSMENT / PLAN / RECOMMENDATIONS:   1) Type 2 Diabetes Mellitus, Optimally   controlled, Without complications - Most recent A1c of 7.0  %. Goal A1c < 7.0 %.    -A1c continues to be optimal  - Praised the pt on weight loss and lifestyle changes  - She has been noted with hypoglycemia after 6 AM, will decrease basal rate at 6 AM as below  - BMP, MA/CR ratio normal  MEDICATIONS: Humalog    Pump   Tandem  Settings   Insulin  type   Humalog     Basal rate       0000 1.7 u/h    0600 0.9 u/h    0900 1.6 u/h    1800 1.550  I:C ratio       0000 1:10   0600 1:10   0900 1:9   1800 1:9      Sensitivity       0000  55              EDUCATION / INSTRUCTIONS: BG monitoring instructions: Patient is  instructed to check her blood sugars 3 times a day, before meals. Call [facility] if: BG persistently < 70  I reviewed the Rule of 15 for the treatment of hypoglycemia in detail with the patient. Literature supplied.   2) Diabetic complications:  Eye: Does not have known diabetic retinopathy.  Neuro/ Feet: Does not have known diabetic peripheral neuropathy. Renal: Patient does not have known baseline CKD. She is not on an ACEI/ARB at present.   3)Dyslipidemia :   - I have switched  atorvastatin  to rosuvastatin  in December, 2024 due to elevated LDL. - Lipid panel is optimal, no change    Medication  rosuvastatin  20 mg daily   Follow-up in 6 months  Signed electronically by: Stefano Redgie Butts, MD  [facility]  [facility] [address] [place], [place] [address] Phone: [phone] FAX: [phone]   CC: [place], [place], [place] [address] [place] [address] Phone: [phone]  Fax: [phone]    Return to Endocrinology clinic as below: No future appointments.

## 2024-02-01 ENCOUNTER — Ambulatory Visit: Payer: Self-pay | Admitting: Internal Medicine

## 2024-02-01 LAB — LIPID PANEL
Cholesterol: 164 mg/dL (ref <200)
HDL: 63 mg/dL (ref >=50)
LDL Cholesterol (Calc): 85 mg/dL
Non-HDL Cholesterol (Calc): 101 mg/dL (ref <130)
Total CHOL/HDL Ratio: 2.6 (calc) (ref <5.0)
Triglycerides: 73 mg/dL (ref <150)

## 2024-02-01 LAB — MICROALBUMIN / CREATININE URINE RATIO
Creatinine, Urine: 32 mg/dL (ref 20–275)
Microalb, Ur: <0.2 mg/dL

## 2024-02-01 LAB — BASIC METABOLIC PANEL WITH GFR
BUN: 8 mg/dL (ref 7–25)
CO2: 30 mmol/L (ref 20–32)
Calcium: 9.5 mg/dL (ref 8.6–10.2)
Chloride: 104 mmol/L (ref 98–110)
Creat: 0.7 mg/dL (ref 0.50–0.99)
Glucose, Bld: 130 mg/dL — ABNORMAL HIGH (ref 65–99)
Potassium: 4.2 mmol/L (ref 3.5–5.3)
Sodium: 139 mmol/L (ref 135–146)
eGFR: 109 mL/min/1.73m2 (ref >=60)

## 2024-02-01 LAB — TSH: TSH: 2.12 m[IU]/L

## 2024-02-01 MED ORDER — ROSUVASTATIN CALCIUM 20 MG PO TABS: 20.0000 mg | ORAL_TABLET | Freq: Every day | ORAL | 3 refills | Status: AC

## 2024-02-16 DIAGNOSIS — E109 Type 1 diabetes mellitus without complications: Secondary | ICD-10-CM | POA: Diagnosis not present

## 2024-02-23 DIAGNOSIS — R300 Dysuria: Secondary | ICD-10-CM | POA: Diagnosis not present

## 2024-03-04 ENCOUNTER — Encounter: Payer: Self-pay | Admitting: Internal Medicine

## 2024-03-05 ENCOUNTER — Encounter: Payer: Self-pay | Admitting: Internal Medicine

## 2024-03-29 ENCOUNTER — Encounter: Payer: Self-pay | Admitting: *Deleted

## 2024-03-29 NOTE — Progress Notes (Signed)
 Robin Alvarez                                          MRN: [id number]   03/29/2024   The [facility] Quality Team Specialist reviewed this patient medical record for the purposes of chart review for care gap closure. The following were reviewed: abstraction for care gap closure-diabetic eye exam and glycemic status assessment.    [facility] Quality Team

## 2024-07-30 ENCOUNTER — Ambulatory Visit: Admitting: Internal Medicine
